# Patient Record
Sex: Female | Born: 1980 | Race: White | Hispanic: No | Marital: Married | Smoking: Never smoker
Health system: Southern US, Community
[De-identification: ages and names within clinical notes are randomized; demographics above are authoritative.]

## PROBLEM LIST (undated history)

## (undated) ENCOUNTER — Inpatient Hospital Stay (HOSPITAL_COMMUNITY): Payer: Self-pay

## (undated) DIAGNOSIS — R87629 Unspecified abnormal cytological findings in specimens from vagina: Secondary | ICD-10-CM

## (undated) DIAGNOSIS — IMO0002 Reserved for concepts with insufficient information to code with codable children: Secondary | ICD-10-CM

## (undated) DIAGNOSIS — F419 Anxiety disorder, unspecified: Secondary | ICD-10-CM

## (undated) DIAGNOSIS — K219 Gastro-esophageal reflux disease without esophagitis: Secondary | ICD-10-CM

## (undated) HISTORY — PX: COLPOSCOPY: SHX161

## (undated) HISTORY — DX: Unspecified abnormal cytological findings in specimens from vagina: R87.629

## (undated) HISTORY — DX: Anxiety disorder, unspecified: F41.9

## (undated) HISTORY — DX: Reserved for concepts with insufficient information to code with codable children: IMO0002

## (undated) HISTORY — PX: WISDOM TOOTH EXTRACTION: SHX21

## (undated) HISTORY — DX: Gastro-esophageal reflux disease without esophagitis: K21.9

---

## 2002-04-25 ENCOUNTER — Other Ambulatory Visit: Admission: RE | Admit: 2002-04-25 | Discharge: 2002-04-25 | Payer: Self-pay | Admitting: Family Medicine

## 2003-07-25 ENCOUNTER — Other Ambulatory Visit: Admission: RE | Admit: 2003-07-25 | Discharge: 2003-07-25 | Payer: Self-pay | Admitting: Family Medicine

## 2004-04-10 ENCOUNTER — Other Ambulatory Visit: Admission: RE | Admit: 2004-04-10 | Discharge: 2004-04-10 | Payer: Self-pay | Admitting: Family Medicine

## 2005-06-05 ENCOUNTER — Other Ambulatory Visit: Admission: RE | Admit: 2005-06-05 | Discharge: 2005-06-05 | Payer: Self-pay | Admitting: Family Medicine

## 2010-05-22 ENCOUNTER — Inpatient Hospital Stay (HOSPITAL_COMMUNITY)
Admission: AD | Admit: 2010-05-22 | Discharge: 2010-05-22 | Disposition: A | Discharge status: Home / Self Care | Payer: 59 | Source: Ambulatory Visit | Attending: Obstetrics and Gynecology | Admitting: Obstetrics and Gynecology

## 2010-05-22 ENCOUNTER — Inpatient Hospital Stay (HOSPITAL_COMMUNITY): Payer: 59

## 2010-05-22 DIAGNOSIS — O209 Hemorrhage in early pregnancy, unspecified: Secondary | ICD-10-CM | POA: Insufficient documentation

## 2010-05-22 LAB — CBC
HCT: 36.3 % (ref 36.0–46.0)
Hemoglobin: 13.2 g/dL (ref 12.0–15.0)
MCV: 80.3 fL (ref 78.0–100.0)
RBC: 4.52 MIL/uL (ref 3.87–5.11)
WBC: 6.3 10*3/uL (ref 4.0–10.5)

## 2010-05-22 LAB — WET PREP, GENITAL
Trich, Wet Prep: NONE SEEN
Yeast Wet Prep HPF POC: NONE SEEN

## 2010-05-22 LAB — HCG, QUANTITATIVE, PREGNANCY: hCG, Beta Chain, Quant, S: 9947 m[IU]/mL — ABNORMAL HIGH (ref <5)

## 2010-05-23 LAB — GC/CHLAMYDIA PROBE AMP, GENITAL: GC Probe Amp, Genital: NEGATIVE

## 2010-06-02 ENCOUNTER — Other Ambulatory Visit (HOSPITAL_COMMUNITY): Payer: Self-pay | Admitting: Obstetrics and Gynecology

## 2010-06-02 DIAGNOSIS — R58 Hemorrhage, not elsewhere classified: Secondary | ICD-10-CM

## 2010-10-16 ENCOUNTER — Other Ambulatory Visit (HOSPITAL_COMMUNITY): Payer: Self-pay | Admitting: Obstetrics and Gynecology

## 2010-10-16 DIAGNOSIS — Z3682 Encounter for antenatal screening for nuchal translucency: Secondary | ICD-10-CM

## 2010-10-28 LAB — HEPATITIS B SURFACE ANTIGEN: Hepatitis B Surface Ag: NEGATIVE

## 2010-10-28 LAB — ABO/RH: RH Type: POSITIVE

## 2010-10-28 LAB — RPR: RPR: NONREACTIVE

## 2010-11-04 ENCOUNTER — Ambulatory Visit (HOSPITAL_COMMUNITY)
Admission: RE | Admit: 2010-11-04 | Discharge: 2010-11-04 | Disposition: A | Discharge status: Home / Self Care | Payer: 59 | Source: Ambulatory Visit | Attending: Obstetrics and Gynecology | Admitting: Obstetrics and Gynecology

## 2010-11-04 ENCOUNTER — Encounter (HOSPITAL_COMMUNITY): Payer: Self-pay

## 2010-11-04 VITALS — BP 129/87 | HR 95 | Wt 185.0 lb

## 2010-11-04 DIAGNOSIS — O351XX0 Maternal care for (suspected) chromosomal abnormality in fetus, not applicable or unspecified: Secondary | ICD-10-CM | POA: Insufficient documentation

## 2010-11-04 DIAGNOSIS — Z3689 Encounter for other specified antenatal screening: Secondary | ICD-10-CM | POA: Insufficient documentation

## 2010-11-04 DIAGNOSIS — O3510X0 Maternal care for (suspected) chromosomal abnormality in fetus, unspecified, not applicable or unspecified: Secondary | ICD-10-CM | POA: Insufficient documentation

## 2010-11-04 DIAGNOSIS — Z3682 Encounter for antenatal screening for nuchal translucency: Secondary | ICD-10-CM

## 2010-11-04 NOTE — Progress Notes (Signed)
Patient seen for ultrasound only appointment today.  Please see [facility]-OBGYN report for details.  

## 2010-11-05 ENCOUNTER — Encounter: Payer: 59 | Admitting: Vascular Surgery

## 2010-11-06 ENCOUNTER — Other Ambulatory Visit: Payer: Self-pay | Admitting: Maternal and Fetal Medicine

## 2011-04-21 LAB — STREP B DNA PROBE: GBS: NEGATIVE

## 2011-04-22 NOTE — L&D Delivery Note (Signed)
Delivery Note Pt reached complete dilation and pushed 2 1/2 hours .  At 8:07 PM a healthy female was delivered via Vaginal, Spontaneous Delivery (Presentation: Right Occiput Anterior).  APGAR: 8, 9; weight 8#5oz. There was a mild shoulder dystocai that required Macroberts, posterior axillary lift, and suprapubic presasure to deliver.   Placenta status: Intact, Spontaneous.  Cord: 3 vessels with the following complications: None.   Anesthesia: Epidural  Episiotomy: n/a Lacerations:Partial 3rd degree Suture Repair: 0-vicryl and 2-0 vicryl to reinforce the rectal sphincter, 3-0 vicryl rapide on remainder of repair Est. Blood Loss (mL): 400cc  Mom to postpartum.  Baby to nursery-stable.  Terri Larsen 05/16/2011, 8:53 PM

## 2011-05-09 ENCOUNTER — Encounter (HOSPITAL_COMMUNITY): Payer: Self-pay | Admitting: *Deleted

## 2011-05-09 ENCOUNTER — Telehealth (HOSPITAL_COMMUNITY): Payer: Self-pay | Admitting: *Deleted

## 2011-05-09 NOTE — Telephone Encounter (Signed)
Preadmission screen  

## 2011-05-15 ENCOUNTER — Other Ambulatory Visit: Payer: Self-pay | Admitting: Obstetrics and Gynecology

## 2011-05-16 ENCOUNTER — Inpatient Hospital Stay (HOSPITAL_COMMUNITY): Payer: 59 | Admitting: Anesthesiology

## 2011-05-16 ENCOUNTER — Inpatient Hospital Stay (HOSPITAL_COMMUNITY)
Admission: RE | Admit: 2011-05-16 | Discharge: 2011-05-18 | DRG: 775 | Disposition: A | Discharge status: Home / Self Care | Payer: 59 | Source: Ambulatory Visit | Attending: Obstetrics and Gynecology | Admitting: Obstetrics and Gynecology

## 2011-05-16 ENCOUNTER — Encounter (HOSPITAL_COMMUNITY): Payer: Self-pay | Admitting: Anesthesiology

## 2011-05-16 ENCOUNTER — Encounter (HOSPITAL_COMMUNITY): Payer: Self-pay

## 2011-05-16 LAB — CBC
HCT: 30.7 % — ABNORMAL LOW (ref 36.0–46.0)
Hemoglobin: 9.3 g/dL — ABNORMAL LOW (ref 12.0–15.0)
MCHC: 33.2 g/dL (ref 30.0–36.0)
MCHC: 33.8 g/dL (ref 30.0–36.0)
Platelets: 140 10*3/uL — ABNORMAL LOW (ref 150–400)
Platelets: 121 10*3/uL — ABNORMAL LOW (ref 150–400)
RDW: 14.7 % (ref 11.5–15.5)
RDW: 14.7 % (ref 11.5–15.5)

## 2011-05-16 LAB — COMPREHENSIVE METABOLIC PANEL
ALT: 9 U/L (ref 0–35)
Albumin: 2.4 g/dL — ABNORMAL LOW (ref 3.5–5.2)
Calcium: 9 mg/dL (ref 8.4–10.5)
GFR calc Af Amer: >90 mL/min (ref >90)
Glucose, Bld: 128 mg/dL — ABNORMAL HIGH (ref 70–99)
Sodium: 132 mEq/L — ABNORMAL LOW (ref 135–145)
Total Protein: 5.7 g/dL — ABNORMAL LOW (ref 6.0–8.3)

## 2011-05-16 LAB — URINALYSIS, ROUTINE W REFLEX MICROSCOPIC
Bilirubin Urine: NEGATIVE
Leukocytes, UA: NEGATIVE
Nitrite: NEGATIVE
Specific Gravity, Urine: 1.01 (ref 1.005–1.030)
pH: 6 (ref 5.0–8.0)

## 2011-05-16 LAB — URINE MICROSCOPIC-ADD ON: WBC, UA: NONE SEEN WBC/hpf (ref <3)

## 2011-05-16 LAB — RPR: RPR Ser Ql: NONREACTIVE

## 2011-05-16 MED ORDER — BENZOCAINE-MENTHOL 20-0.5 % EX AERO
1.0000 "application " | INHALATION_SPRAY | CUTANEOUS | Status: DC | PRN
  Administered 2011-05-16: 1 via TOPICAL

## 2011-05-16 MED ORDER — DIPHENHYDRAMINE HCL 25 MG PO CAPS: 25.0000 mg | ORAL_CAPSULE | Freq: Four times a day (QID) | ORAL | Status: DC | PRN

## 2011-05-16 MED ORDER — OXYTOCIN BOLUS FROM INFUSION
500.0000 mL | Freq: Once | INTRAVENOUS | Status: DC
  Filled 2011-05-16: qty 500

## 2011-05-16 MED ORDER — OXYCODONE-ACETAMINOPHEN 5-325 MG PO TABS
1.0000 | ORAL_TABLET | ORAL | Status: DC | PRN
  Administered 2011-05-17 – 2011-05-18 (×5): 1 via ORAL
  Filled 2011-05-16 (×5): qty 1

## 2011-05-16 MED ORDER — LANOLIN HYDROUS EX OINT: TOPICAL_OINTMENT | CUTANEOUS | Status: DC | PRN

## 2011-05-16 MED ORDER — EPHEDRINE 5 MG/ML INJ: 10.0000 mg | INTRAVENOUS | Status: DC | PRN

## 2011-05-16 MED ORDER — DIPHENHYDRAMINE HCL 50 MG/ML IJ SOLN: 12.5000 mg | INTRAMUSCULAR | Status: DC | PRN

## 2011-05-16 MED ORDER — PHENYLEPHRINE 40 MCG/ML (10ML) SYRINGE FOR IV PUSH (FOR BLOOD PRESSURE SUPPORT): 80.0000 ug | PREFILLED_SYRINGE | INTRAVENOUS | Status: DC | PRN

## 2011-05-16 MED ORDER — OXYCODONE-ACETAMINOPHEN 5-325 MG PO TABS: 2.0000 | ORAL_TABLET | ORAL | Status: DC | PRN

## 2011-05-16 MED ORDER — PHENYLEPHRINE 40 MCG/ML (10ML) SYRINGE FOR IV PUSH (FOR BLOOD PRESSURE SUPPORT)
80.0000 ug | PREFILLED_SYRINGE | INTRAVENOUS | Status: DC | PRN
  Filled 2011-05-16: qty 5

## 2011-05-16 MED ORDER — ONDANSETRON HCL 4 MG/2ML IJ SOLN: 4.0000 mg | INTRAMUSCULAR | Status: DC | PRN

## 2011-05-16 MED ORDER — FLEET ENEMA 7-19 GM/118ML RE ENEM: 1.0000 | ENEMA | RECTAL | Status: DC | PRN

## 2011-05-16 MED ORDER — LACTATED RINGERS IV SOLN
500.0000 mL | Freq: Once | INTRAVENOUS | Status: AC
  Administered 2011-05-16: 500 mL via INTRAVENOUS

## 2011-05-16 MED ORDER — WITCH HAZEL-GLYCERIN EX PADS
1.0000 "application " | MEDICATED_PAD | CUTANEOUS | Status: DC | PRN
  Administered 2011-05-16: 1 via TOPICAL

## 2011-05-16 MED ORDER — OXYTOCIN 20 UNITS IN LACTATED RINGERS INFUSION - SIMPLE
125.0000 mL/h | Freq: Once | INTRAVENOUS | Status: AC
  Administered 2011-05-16: 125 mL/h via INTRAVENOUS

## 2011-05-16 MED ORDER — IBUPROFEN 600 MG PO TABS
600.0000 mg | ORAL_TABLET | Freq: Four times a day (QID) | ORAL | Status: DC
  Administered 2011-05-16 – 2011-05-18 (×7): 600 mg via ORAL
  Filled 2011-05-16 (×7): qty 1

## 2011-05-16 MED ORDER — TETANUS-DIPHTH-ACELL PERTUSSIS 5-2.5-18.5 LF-MCG/0.5 IM SUSP: 0.5000 mL | Freq: Once | INTRAMUSCULAR | Status: DC

## 2011-05-16 MED ORDER — ONDANSETRON HCL 4 MG PO TABS: 4.0000 mg | ORAL_TABLET | ORAL | Status: DC | PRN

## 2011-05-16 MED ORDER — DIBUCAINE 1 % RE OINT
1.0000 "application " | TOPICAL_OINTMENT | RECTAL | Status: DC | PRN
  Administered 2011-05-16: 1 via RECTAL
  Filled 2011-05-16: qty 28

## 2011-05-16 MED ORDER — LIDOCAINE HCL 1.5 % IJ SOLN
INTRAMUSCULAR | Status: DC | PRN
  Administered 2011-05-16 (×2): 4 mL via INTRADERMAL

## 2011-05-16 MED ORDER — CITRIC ACID-SODIUM CITRATE 334-500 MG/5ML PO SOLN: 30.0000 mL | ORAL | Status: DC | PRN

## 2011-05-16 MED ORDER — FENTANYL 2.5 MCG/ML BUPIVACAINE 1/10 % EPIDURAL INFUSION (WH - ANES)
14.0000 mL/h | INTRAMUSCULAR | Status: DC
  Administered 2011-05-16 (×3): 14 mL/h via EPIDURAL
  Filled 2011-05-16 (×3): qty 60

## 2011-05-16 MED ORDER — ONDANSETRON HCL 4 MG/2ML IJ SOLN: 4.0000 mg | Freq: Four times a day (QID) | INTRAMUSCULAR | Status: DC | PRN

## 2011-05-16 MED ORDER — PANTOPRAZOLE SODIUM 40 MG PO TBEC
40.0000 mg | DELAYED_RELEASE_TABLET | Freq: Every day | ORAL | Status: DC
  Filled 2011-05-16 (×3): qty 1

## 2011-05-16 MED ORDER — TERBUTALINE SULFATE 1 MG/ML IJ SOLN: 0.2500 mg | Freq: Once | INTRAMUSCULAR | Status: DC | PRN

## 2011-05-16 MED ORDER — ZOLPIDEM TARTRATE 5 MG PO TABS: 5.0000 mg | ORAL_TABLET | Freq: Every evening | ORAL | Status: DC | PRN

## 2011-05-16 MED ORDER — LACTATED RINGERS IV SOLN: 500.0000 mL | INTRAVENOUS | Status: DC | PRN

## 2011-05-16 MED ORDER — SENNOSIDES-DOCUSATE SODIUM 8.6-50 MG PO TABS
2.0000 | ORAL_TABLET | Freq: Every day | ORAL | Status: DC
  Administered 2011-05-17: 2 via ORAL

## 2011-05-16 MED ORDER — OXYTOCIN 20 UNITS IN LACTATED RINGERS INFUSION - SIMPLE
1.0000 m[IU]/min | INTRAVENOUS | Status: DC
Start: 2011-05-16 — End: 2011-05-16
  Administered 2011-05-16: 2 m[IU]/min via INTRAVENOUS
  Filled 2011-05-16: qty 1000

## 2011-05-16 MED ORDER — PRENATAL MULTIVITAMIN CH
1.0000 | ORAL_TABLET | Freq: Every day | ORAL | Status: DC
  Administered 2011-05-17 – 2011-05-18 (×2): 1 via ORAL
  Filled 2011-05-16 (×2): qty 1

## 2011-05-16 MED ORDER — ACETAMINOPHEN 325 MG PO TABS: 650.0000 mg | ORAL_TABLET | ORAL | Status: DC | PRN

## 2011-05-16 MED ORDER — IBUPROFEN 600 MG PO TABS: 600.0000 mg | ORAL_TABLET | Freq: Four times a day (QID) | ORAL | Status: DC | PRN

## 2011-05-16 MED ORDER — LIDOCAINE HCL (PF) 1 % IJ SOLN
30.0000 mL | INTRAMUSCULAR | Status: DC | PRN
  Filled 2011-05-16: qty 30

## 2011-05-16 MED ORDER — BENZOCAINE-MENTHOL 20-0.5 % EX AERO
INHALATION_SPRAY | CUTANEOUS | Status: AC
  Filled 2011-05-16: qty 56

## 2011-05-16 MED ORDER — LACTATED RINGERS IV SOLN
INTRAVENOUS | Status: DC
  Administered 2011-05-16 (×2): 125 mL/h via INTRAVENOUS

## 2011-05-16 MED ORDER — SIMETHICONE 80 MG PO CHEW: 80.0000 mg | CHEWABLE_TABLET | ORAL | Status: DC | PRN

## 2011-05-16 MED ORDER — EPHEDRINE 5 MG/ML INJ
10.0000 mg | INTRAVENOUS | Status: DC | PRN
  Filled 2011-05-16: qty 4

## 2011-05-16 NOTE — H&P (Signed)
Terri Larsen is a 31 y.o. female presenting at 40 weeks for induction of labor given term status and favorable cervix.EDD 05/16/11 by LMP c/w 9 week [place]) Prenatal care has been significant only for some elevated diastolic BP to 90's but no sign of preeclampsia.  Labs normal and no proteinuria.  Pt was a possible thickened NT on [place] at 9 weeks, but normal first trimester screen.  History OB History    Grav Para Term Preterm Abortions TAB SAB Ect Mult Living   2 0 0 0 1 0 1 0 0 0     SAB x 1   Past Medical History  Diagnosis Date  . Anxiety     no meds  . GERD (gastroesophageal reflux disease)     middle school  . Abnormal Pap smear    Past Surgical History  Procedure Date  . Colposcopy   . Wisdom tooth extraction    Family History: family history includes Alcohol abuse in her maternal aunt, maternal grandfather, and maternal uncle; Cancer in her maternal grandmother, paternal aunt, and paternal grandmother; Diabetes in her father and paternal grandfather; GER disease in her mother; Heart attack in her paternal grandfather; Heart disease in her paternal aunt, paternal grandfather, and paternal uncle; Hypertension in her father, paternal aunt, paternal grandfather, and paternal uncle; and Seizures in her mother. Social History:  reports that she has never smoked. She has never used smokeless tobacco. She reports that she does not drink alcohol or use illicit drugs.  Review of Systems  Neurological: Negative for headaches.      Blood pressure 133/95, pulse 109, temperature 98.3 F (36.8 C), temperature source Oral, resp. rate 18, height 5\' 5"  (1.651 m), weight 95.255 kg (210 lb), last menstrual period 08/09/2010. Maternal Exam:  Uterine Assessment: Contraction strength is mild.  Contraction frequency is irregular.   Abdomen: Patient reports no abdominal tenderness. Fetal presentation: vertex  Introitus: Normal vulva. Normal vagina.    Fetal Exam Fetal State Assessment: Category I -  tracings are normal.     Physical Exam  Constitutional: She is oriented to person, place, and time. She appears well-developed and well-nourished.  Cardiovascular: Normal rate and regular rhythm.   Respiratory: Effort normal and breath sounds normal.  GI: Soft. Bowel sounds are normal.  Genitourinary: Vagina normal and uterus normal.  Neurological: She is alert and oriented to person, place, and time.  Psychiatric: She has a normal mood and affect.    Prenatal labs: ABO, Rh: O/Positive/-- (07/09 0000) Antibody: Negative (07/09 0000) Rubella: Immune (07/09 0000) RPR: Nonreactive (07/09 0000)  HBsAg: Negative (07/09 0000)  HIV: Non-reactive (07/09 0000)  GBS: Negative (12/31 0000)  One hour GTT 117 First trimester scree and AFP negative  Assessment/Plan: Pt for induction of labor with favorable cervix at 40 weeks, has had some borderline BP withdiastolics to 90, but normal labs and no proteinuria to suggest preeclampsia.  Pitocin and AROM. Oliver Pila 05/16/2011, 8:07 AM

## 2011-05-16 NOTE — Anesthesia Procedure Notes (Signed)
Epidural Patient location during procedure: OB Start time: 05/16/2011 11:46 AM Reason for block: procedure for pain  Staffing Performed by: anesthesiologist   Preanesthetic Checklist Completed: patient identified, site marked, surgical consent, pre-op evaluation, timeout performed, IV checked, risks and benefits discussed and monitors and equipment checked  Epidural Patient position: sitting Prep: site prepped and draped and DuraPrep Patient monitoring: continuous pulse ox and blood pressure Approach: midline Injection technique: LOR air  Needle:  Needle type: Tuohy  Needle gauge: 17 G Needle length: 9 cm Needle insertion depth: 7 cm Catheter type: closed end flexible Catheter size: 19 Gauge Catheter at skin depth: 12 cm Test dose: negative  Assessment Events: blood not aspirated, injection not painful, no injection resistance, negative IV test and no paresthesia  Additional Notes Discussed risk of headache, infection, bleeding, nerve injury and failed or incomplete block.  Patient voices understanding and wishes to proceed.

## 2011-05-16 NOTE — Progress Notes (Signed)
Called Anesthesia with repeat Platelet count. MD orders ok to pull epidural cath at this time.

## 2011-05-16 NOTE — Progress Notes (Signed)
Dr. Senaida Ores at bedside and notified of pt status, FHR, UC pattern, pitocin amount, and epidural placement.  Will continue to monitor.

## 2011-05-16 NOTE — Anesthesia Preprocedure Evaluation (Signed)
Anesthesia Evaluation  Patient identified by MRN, date of birth, ID band Patient awake    Reviewed: Allergy & Precautions, H&P , NPO status , Patient's Chart, lab work & pertinent test results, reviewed documented beta blocker date and time   History of Anesthesia Complications Negative for: history of anesthetic complications  Airway Mallampati: I TM Distance: >3 FB Neck ROM: full    Dental  (+) Teeth Intact   Pulmonary neg pulmonary ROS,  clear to auscultation        Cardiovascular hypertension (GHTN past 3-4 weeks), regular Normal    Neuro/Psych Negative Neurological ROS  Negative Psych ROS   GI/Hepatic Neg liver ROS, GERD-  ,  Endo/Other  Negative Endocrine ROS  Renal/GU negative Renal ROS  Genitourinary negative   Musculoskeletal   Abdominal   Peds  Hematology negative hematology ROS (+)   Anesthesia Other Findings   Reproductive/Obstetrics (+) Pregnancy                           Anesthesia Physical Anesthesia Plan  ASA: II  Anesthesia Plan: Epidural   Post-op Pain Management:    Induction:   Airway Management Planned:   Additional Equipment:   Intra-op Plan:   Post-operative Plan:   Informed Consent: I have reviewed the patients History and Physical, chart, labs and discussed the procedure including the risks, benefits and alternatives for the proposed anesthesia with the patient or authorized representative who has indicated his/her understanding and acceptance.     Plan Discussed with:   Anesthesia Plan Comments:         Anesthesia Quick Evaluation

## 2011-05-16 NOTE — Progress Notes (Signed)
   Subjective: Some occasional pressure  Objective: BP 139/89  Pulse 90  Temp(Src) 97.8 F (36.6 C) (Axillary)  Resp 20  Ht 5\' 5"  (1.651 m)  Wt 95.255 kg (210 lb)  BMI 34.95 kg/m2  SpO2 99%  LMP 08/09/2010   Total I/O In: -  Out: 50 [Urine:50]  FHT:  FHR: 135 bpm, variability: moderate,  accelerations:  Present,  decelerations:  Present mild early decels UC:   regular, every 2-3 minutes SVE:  C/9/0  Labs: Lab Results  Component Value Date   WBC 6.7 05/16/2011   HGB 10.2* 05/16/2011   HCT 30.7* 05/16/2011   MCV 79.5 05/16/2011   PLT 140* 05/16/2011    Assessment / Plan: Good progress.     Oliver Pila 05/16/2011, 4:12 PM

## 2011-05-16 NOTE — Progress Notes (Signed)
   Subjective: Pt feels mild ctx only Objective: BP 130/88  Pulse 90  Temp(Src) 98.3 F (36.8 C) (Oral)  Resp 20  Ht 5\' 5"  (1.651 m)  Wt 95.255 kg (210 lb)  BMI 34.95 kg/m2  LMP 08/09/2010      FHT:  FHR: 145 bpm, variability: moderate,  accelerations:  Present,  decelerations:  Absent UC:   irregular, every 6-8 minutes SVE:   Dilation: 3.5 Effacement (%): 60 Station: -2 Exam by:: Dr. Senaida Ores AROM clear  Labs: Lab Results  Component Value Date   WBC 6.7 05/16/2011   HGB 10.2* 05/16/2011   HCT 30.7* 05/16/2011   MCV 79.5 05/16/2011   PLT 140* 05/16/2011    Assessment / Plan: On pitocin, plans epidural. Will get PIH labs and check urine for protein.  Terri Larsen 05/16/2011, 9:23 AM

## 2011-05-16 NOTE — Progress Notes (Signed)
   Subjective: Comfortable with epidural  Objective: BP 148/103  Pulse 83  Temp(Src) 97.3 F (36.3 C) (Axillary)  Resp 20  Ht 5\' 5"  (1.651 m)  Wt 95.255 kg (210 lb)  BMI 34.95 kg/m2  SpO2 99%  LMP 08/09/2010   Total I/O In: -  Out: 50 [Urine:50]  FHT:  FHR: 130 bpm, variability: moderate,  accelerations:  Present,  decelerations:  Absent UC:   regular, every 3-5 minutes SVE:   Dilation: 4.5 Effacement (%): 70 Station: -1 Exam by:: Dr. Senaida Ores IUPC placed Labs: Lab Results  Component Value Date   WBC 6.7 05/16/2011   HGB 10.2* 05/16/2011   HCT 30.7* 05/16/2011   MCV 79.5 05/16/2011   PLT 140* 05/16/2011    Assessment / Plan: Follow progress Terri Larsen W 05/16/2011, 1:02 PM

## 2011-05-16 NOTE — Progress Notes (Signed)
pt does not desire anything at this point for pain

## 2011-05-17 LAB — CBC
HCT: 26.7 % — ABNORMAL LOW (ref 36.0–46.0)
Hemoglobin: 8.8 g/dL — ABNORMAL LOW (ref 12.0–15.0)
MCH: 26.6 pg (ref 26.0–34.0)
MCHC: 33 g/dL (ref 30.0–36.0)
MCV: 80.7 fL (ref 78.0–100.0)
Platelets: 125 K/uL — ABNORMAL LOW (ref 150–400)
RBC: 3.31 MIL/uL — ABNORMAL LOW (ref 3.87–5.11)
RDW: 14.6 % (ref 11.5–15.5)
WBC: 10.3 K/uL (ref 4.0–10.5)

## 2011-05-17 NOTE — Progress Notes (Signed)
Post Partum Day 1 Subjective: up ad lib, voiding and tolerating PO  Objective: Blood pressure 120/78, pulse 78, temperature 97.6 F (36.4 C), temperature source Axillary, resp. rate 20, height 5\' 5"  (1.651 m), weight 95.255 kg (210 lb), last menstrual period 08/09/2010, SpO2 98.00%, unknown if currently breastfeeding.  Physical Exam:  General: alert Lochia: appropriate Uterine Fundus: firm    Basename 05/17/11 0500 05/16/11 2124  HGB 8.8* 9.3*  HCT 26.7* 27.5*    Assessment/Plan: Plan for discharge tomorrow   LOS: 1 day   Terri Larsen W 05/17/2011, 10:38 AM

## 2011-05-18 MED ORDER — OXYCODONE-ACETAMINOPHEN 5-325 MG PO TABS: 1.0000 | ORAL_TABLET | ORAL | Status: AC | PRN

## 2011-05-18 MED ORDER — IBUPROFEN 600 MG PO TABS: 600.0000 mg | ORAL_TABLET | Freq: Four times a day (QID) | ORAL | Status: AC

## 2011-05-18 NOTE — Discharge Summary (Signed)
Obstetric Discharge Summary Reason for Admission: induction of labor Prenatal Procedures: none Intrapartum Procedures: spontaneous vaginal delivery Postpartum Procedures: none Complications-Operative and Postpartum: partial third degree degree perineal laceration Hemoglobin  Date Value Range Status  05/17/2011 8.8* 12.0-15.0 (g/dL) Final     HCT  Date Value Range Status  05/17/2011 26.7* 36.0-46.0 (%) Final    Discharge Diagnoses: Term Pregnancy-delivered  Discharge Information: Date: 05/18/2011 Activity: pelvic rest Diet: routine Medications: Ibuprofen and Percocet Condition: improved Instructions: refer to practice specific booklet Discharge to: home   Newborn Data: Live born female  Birth Weight: 8 lb 5.3 oz (3780 g) APGAR: 8, 9  Home with mother.  Terri Larsen 05/18/2011, 8:04 AM

## 2011-05-18 NOTE — Progress Notes (Signed)
Post Partum Day 2 Subjective: up ad lib, voiding and tolerating PO  Objective: Blood pressure 113/76, pulse 87, temperature 97.6 F (36.4 C), temperature source Oral, resp. rate 18, height 5\' 5"  (1.651 m), weight 95.255 kg (210 lb), last menstrual period 08/09/2010, SpO2 98.00%, unknown if currently breastfeeding.  Physical Exam:  General: alert Lochia: appropriate Uterine Fundus: firm    Basename 05/17/11 0500 05/16/11 2124  HGB 8.8* 9.3*  HCT 26.7* 27.5*    Assessment/Plan: Discharge home Motrin and percocet F/u 6 weeks   LOS: 2 days   Furqan Gosselin W 05/18/2011, 8:07 AM

## 2011-05-19 NOTE — Anesthesia Postprocedure Evaluation (Signed)
Patient stable following vaginal delivery.  

## 2014-02-20 ENCOUNTER — Encounter (HOSPITAL_COMMUNITY): Payer: Self-pay

## 2014-04-21 NOTE — L&D Delivery Note (Signed)
Delivery Note At 5:46 PM a viable and healthy female was delivered via Vaginal, Spontaneous Delivery (Presentation: OA, ROT ).  APGAR: 9, 9; weight P .   Placenta status: Intact, Spontaneous.  Cord: 3 vessels with the following complications: None.    Anesthesia: Epidural  Episiotomy:  none Lacerations:  2nd degree lac Suture Repair: 3.0 vicryl rapide Est. Blood Loss (mL):  90cc  Mom to postpartum.  Baby to Couplet care / Skin to Skin.  Bovard-Stuckert, Hannan Tetzlaff 12/21/2014, 6:15 PM  Br/Contra?/RI/Tdap in [facility]  D/w pt and FOB circumcision for female infant - including bleeding, infection and removing too much or too little skin, wishes to proceed.

## 2014-05-25 LAB — OB RESULTS CONSOLE HEPATITIS B SURFACE ANTIGEN: Hepatitis B Surface Ag: NEGATIVE

## 2014-05-25 LAB — OB RESULTS CONSOLE ABO/RH: RH Type: POSITIVE

## 2014-05-25 LAB — OB RESULTS CONSOLE GC/CHLAMYDIA
Chlamydia: NEGATIVE
Gonorrhea: NEGATIVE

## 2014-05-25 LAB — OB RESULTS CONSOLE HIV ANTIBODY (ROUTINE TESTING): HIV: NONREACTIVE

## 2014-05-25 LAB — OB RESULTS CONSOLE ANTIBODY SCREEN: ANTIBODY SCREEN: NEGATIVE

## 2014-05-25 LAB — OB RESULTS CONSOLE RUBELLA ANTIBODY, IGM: Rubella: IMMUNE

## 2014-06-24 ENCOUNTER — Encounter (HOSPITAL_COMMUNITY): Payer: Self-pay

## 2014-06-24 ENCOUNTER — Inpatient Hospital Stay (HOSPITAL_COMMUNITY)
Admission: AD | Admit: 2014-06-24 | Discharge: 2014-06-24 | Disposition: A | Discharge status: Home / Self Care | Payer: 59 | Source: Ambulatory Visit | Attending: Obstetrics and Gynecology | Admitting: Obstetrics and Gynecology

## 2014-06-24 DIAGNOSIS — A059 Bacterial foodborne intoxication, unspecified: Secondary | ICD-10-CM

## 2014-06-24 DIAGNOSIS — O9989 Other specified diseases and conditions complicating pregnancy, childbirth and the puerperium: Secondary | ICD-10-CM | POA: Insufficient documentation

## 2014-06-24 DIAGNOSIS — K529 Noninfective gastroenteritis and colitis, unspecified: Secondary | ICD-10-CM | POA: Diagnosis not present

## 2014-06-24 DIAGNOSIS — Z3A14 14 weeks gestation of pregnancy: Secondary | ICD-10-CM

## 2014-06-24 DIAGNOSIS — O99891 Other specified diseases and conditions complicating pregnancy: Secondary | ICD-10-CM

## 2014-06-24 DIAGNOSIS — Z3A13 13 weeks gestation of pregnancy: Secondary | ICD-10-CM | POA: Insufficient documentation

## 2014-06-24 DIAGNOSIS — O210 Mild hyperemesis gravidarum: Secondary | ICD-10-CM | POA: Diagnosis present

## 2014-06-24 LAB — URINALYSIS, ROUTINE W REFLEX MICROSCOPIC
Bilirubin Urine: NEGATIVE
GLUCOSE, UA: NEGATIVE mg/dL
Hgb urine dipstick: NEGATIVE
KETONES UR: 15 mg/dL — AB
LEUKOCYTES UA: NEGATIVE
NITRITE: NEGATIVE
PH: 8 (ref 5.0–8.0)
Protein, ur: NEGATIVE mg/dL
Specific Gravity, Urine: 1.015 (ref 1.005–1.030)
UROBILINOGEN UA: 0.2 mg/dL (ref 0.0–1.0)

## 2014-06-24 MED ORDER — PROMETHAZINE HCL 25 MG RE SUPP: 25.0000 mg | Freq: Four times a day (QID) | RECTAL | Status: DC | PRN

## 2014-06-24 MED ORDER — DEXTROSE 5 % IN LACTATED RINGERS IV BOLUS
1000.0000 mL | Freq: Once | INTRAVENOUS | Status: AC
  Administered 2014-06-24: 1000 mL via INTRAVENOUS

## 2014-06-24 MED ORDER — PROMETHAZINE HCL 25 MG/ML IJ SOLN
25.0000 mg | Freq: Once | INTRAVENOUS | Status: AC
  Administered 2014-06-24: 25 mg via INTRAVENOUS
  Filled 2014-06-24: qty 1

## 2014-06-24 MED ORDER — PROMETHAZINE HCL 25 MG PO TABS: 25.0000 mg | ORAL_TABLET | Freq: Four times a day (QID) | ORAL | Status: DC | PRN

## 2014-06-24 NOTE — MAU Note (Signed)
Pt states she and her sister ate crab dip. Pt began vomiting around 0200 this am, then hourly until 0800. No bleeding or abnormal vaginal discharge.

## 2014-06-24 NOTE — Discharge Instructions (Signed)
Food Poisoning °Food poisoning is an illness caused by something you ate or drank. There are over 250 known causes of food poisoning. However, many other causes are unknown. You can be treated even if the exact cause of your food poisoning is not known. In most cases, food poisoning is mild and lasts 1 to 2 days. However, some cases can be serious, especially for people with low immune systems, the elderly, children and infants, and pregnant women. °CAUSES  °Poor personal hygiene, improper cleaning of storage and preparation areas, and unclean utensils can cause infection or tainting (contamination) of foods. The causes of food poisoning are numerous. Infectious agents, such as viruses, bacteria, or parasites, can cause harm by infecting the intestine and disrupting the absorption of nutrients and water. This can cause diarrhea and lead to dehydration. Viruses are responsible for most of the food poisonings in which an agent is found. Parasites are less likely to cause food poisoning. Toxic agents, such as poisonous mushrooms, marine algae, and pesticides can also cause food poisoning. °· Viral causes of food poisoning include: °¨ Norovirus. °¨ Rotavirus. °¨ Hepatitis A. °· Bacterial causes of food poisoning include: °¨ Salmonellae. °¨ Campylobacter. °¨ Bacillus cereus. °¨ Escherichia coli (E. coli). °¨ Shigella. °¨ Listeria monocytogenes. °¨ Clostridium botulinum (botulism). °¨ Vibrio cholerae. °· Parasites that can cause food poisoning include: °¨ Giardia. °¨ Cryptosporidium. °¨ Toxoplasma. °SYMPTOMS °Symptoms may appear several hours or longer after consuming the contaminated food or drink. Symptoms may include: °· Nausea. °· Vomiting. °· Cramping. °· Diarrhea. °· Fever and chills. °· Muscle aches. °DIAGNOSIS °Your health care provider may be able to diagnose food poisoning from a list of what you have recently eaten and results from lab tests. Diagnostic tests may include an exam of the feces. °TREATMENT °In  most cases, treatment focuses on helping to relieve your symptoms and staying well hydrated. Antibiotic medicines are rarely needed. In severe cases, hospitalization may be required. °HOME CARE INSTRUCTIONS  °· Drink enough water and fluids to keep your urine clear or pale yellow. Drink small amounts of fluids frequently and increase as tolerated. °· Ask your health care provider for specific rehydration instructions. °· Avoid: °¨ Foods high in sugar. °¨ Alcohol. °¨ Carbonated drinks. °¨ Tobacco. °¨ Juice. °¨ Caffeine drinks. °¨ Extremely hot or cold fluids. °¨ Fatty, greasy foods. °¨ Too much intake of anything at one time. °¨ Dairy products until 24 to 48 hours after diarrhea stops. °· You may consume probiotics. Probiotics are active cultures of beneficial bacteria. They may lessen the amount and number of diarrheal stools in adults. Probiotics can be found in yogurt with active cultures and in supplements. °· Wash your hands well to avoid spreading the bacteria. °· Take medicines only as directed by your health care provider. Do not give your child aspirin because of the association with Reye's syndrome. °· Ask your health care provider if you should continue to take your regular prescribed and over-the-counter medicines. °PREVENTION  °· Wash your hands, food preparation surfaces, and utensils thoroughly before and after handling raw foods. °· Keep refrigerated foods below 40°F (5°C). °· Serve hot foods immediately or keep them heated above 140°F (60°C). °· Divide large volumes of food into small portions for rapid cooling in the refrigerator. Hot, bulky foods in the refrigerator can raise the temperature of other foods that have already cooled. °· Follow approved canning procedures. °· Heat canned foods thoroughly before tasting. °· When in doubt, throw it out. °· Infants, the elderly, women   who are pregnant, and people with compromised immune systems are especially susceptible to food poisoning. These people  should never consume unpasteurized cheese, unpasteurized cider, raw fish, raw seafood, or raw meat-type products. °SEEK IMMEDIATE MEDICAL CARE IF:  °· You have difficulty breathing, swallowing, talking, or moving. °· You develop blurred vision. °· You are unable to keep fluids down. °· You faint or nearly faint. °· Your eyes turn yellow. °· Vomiting or diarrhea develops or becomes persistent. °· Abdominal pain develops, increases, or localizes in one small area. °· You have a fever. °· The diarrhea becomes excessive or contains blood or mucus. °· You develop excessive weakness, dizziness, or extreme thirst. °· You have no urine for 8 hours. °MAKE SURE YOU:  °· Understand these instructions. °· Will watch your condition. °· Will get help right away if you are not doing well or get worse. °Document Released: 01/04/2004 Document Revised: 08/22/2013 Document Reviewed: 08/22/2010 °ExitCare® Patient Information ©2015 [employer]. This information is not intended to replace advice given to you by your health care provider. Make sure you discuss any questions you have with your health care provider. ° °

## 2014-06-24 NOTE — MAU Provider Note (Signed)
Chief Complaint: Emesis   First Provider Initiated Contact with Patient 06/24/14 [phone]     SUBJECTIVE HPI: Terri Larsen is a 34 y.o. G3P1011 at [redacted]w[redacted]d by LMP who presents with nausea and vomiting since 2 AM. Vomited at least 6 times. Unable to keep anything down. Denies fever, chills, diarrhea. Sister has same symptoms. Thinks it may be from crab dip that she and her sister ate at 7 PM last night.   Past Medical History  Diagnosis Date  . Anxiety     no meds  . GERD (gastroesophageal reflux disease)     middle school  . Abnormal Pap smear    OB History  Gravida Para Term Preterm AB SAB TAB Ectopic Multiple Living  0 1 1 0 0 0 1    # Outcome Date GA Lbr Len/2nd Weight Sex Delivery Anes PTL Lv  3 Current           2 Term 05/16/11 [redacted]w[redacted]d 05:04 / 02:33 3.78 kg (8 lb 5.3 oz) F Vag-Spont EPI  Y  1 SAB  [redacted]w[redacted]d            Past Surgical History  Procedure Laterality Date  . Colposcopy    . Wisdom tooth extraction     History   Social History  . Marital Status: Married    Spouse Name: N/A  . Number of Children: N/A  . Years of Education: N/A   Occupational History  . Not on file.   Social History Main Topics  . Smoking status: Never Smoker   . Smokeless tobacco: Never Used  . Alcohol Use: No  . Drug Use: No  . Sexual Activity: Yes   Other Topics Concern  . Not on file   Social History Narrative   No current facility-administered medications on file prior to encounter.   Current Outpatient Prescriptions on File Prior to Encounter  Medication Sig Dispense Refill  . pantoprazole (PROTONIX) 40 MG tablet Take 40 mg by mouth daily.      . Prenatal Vit-Fe Fumarate-FA (PRENATAL MULTIVITAMIN) TABS Take 1 tablet by mouth daily.     No Known Allergies  Review of Systems  Constitutional: Negative for fever and chills.  HENT: Negative for congestion and sore throat.   Respiratory: Negative for cough.   Gastrointestinal: Positive for nausea and vomiting. Negative for  abdominal pain and diarrhea.  Genitourinary:       Negative for vaginal bleeding.  Musculoskeletal: Negative for myalgias.   OBJECTIVE Blood pressure 151/90, pulse 104, temperature 97.9 F (36.6 C), temperature source Oral, resp. rate 16, height 5' 5.5" (1.664 m), weight 92.761 kg (204 lb 8 oz), not currently breastfeeding.  Patient Vitals for the past 24 hrs:  BP Temp Temp src Pulse Resp Height Weight  06/24/14 0920 151/90 mmHg 97.9 F (36.6 C) Oral 104 16 - -  06/24/14 0917 - - - - - 5' 5.5" (1.664 m) 92.761 kg (204 lb 8 oz)   GENERAL: Well-developed, well-nourished female in no acute distress.  HEENT: Normocephalic. Mucous membranes moist. HEART: normal rate RESP: normal effort ABDOMEN: Soft, non-tender. Gravid, size equals dates. EXTREMITIES: Nontender, no edema NEURO: Alert and oriented SPECULUM EXAM: Deferred Fetal heart rate 140 by Doppler.  LAB RESULTS Results for orders placed or performed during the hospital encounter of 06/24/14 (from the past 24 hour(s))  Urinalysis, Routine w reflex microscopic     Status: Abnormal   Collection Time: 06/24/14  9:08 AM  Result Value Ref Range  Color, Urine YELLOW YELLOW   APPearance CLEAR CLEAR   Specific Gravity, Urine 1.015 1.005 - 1.030   pH 8.0 5.0 - 8.0   Glucose, UA NEGATIVE NEGATIVE mg/dL   Hgb urine dipstick NEGATIVE NEGATIVE   Bilirubin Urine NEGATIVE NEGATIVE   Ketones, ur 15 (A) NEGATIVE mg/dL   Protein, ur NEGATIVE NEGATIVE mg/dL   Urobilinogen, UA 0.2 0.0 - 1.0 mg/dL   Nitrite NEGATIVE NEGATIVE   Leukocytes, UA NEGATIVE NEGATIVE    IMAGING No results found.  MAU COURSE Patient doesn't think she can keep down oral medication. Phenergan and IV fluid bolus.   No further vomiting in maternity admissions. Feeling better. Tolerating PO's.   ASSESSMENT 1. Foodborne gastroenteritis   2. Current maternal conditions classifiable elsewhere, antepartum     PLAN Discharge home in stable condition per consult with  Dr. Ellyn HackBovard. Clear liquid diet 24 hours, then advance slowly as tolerated.     Follow-up Information    Follow up with Sherian ReinBovard-Stuckert, Jody, MD.   Specialty:  Obstetrics and Gynecology   Why:  As scheduled   Contact information:   [address] [place] [phone]       [facility].   Why:  As needed in emergencies   Contact information:   [address] [id number] [place] [id number] [phone]       Medication List    TAKE these medications        cetirizine 10 MG tablet  Commonly known as:  ZYRTEC  Take 10 mg by mouth daily.     pantoprazole 40 MG tablet  Commonly known as:  PROTONIX  Take 40 mg by mouth daily.     prenatal multivitamin Tabs tablet  Take 1 tablet by mouth daily.     promethazine 25 MG suppository  Commonly known as:  PHENERGAN  Place 1 suppository (25 mg total) rectally every 6 (six) hours as needed for nausea or vomiting.     promethazine 25 MG tablet  Commonly known as:  PHENERGAN  Take 1 tablet (25 mg total) by mouth every 6 (six) hours as needed.       [place] Dam[place]ri Oz[place], CNM 06/24/2014  11:32 AM

## 2014-12-13 ENCOUNTER — Telehealth (HOSPITAL_COMMUNITY): Payer: Self-pay | Admitting: *Deleted

## 2014-12-13 ENCOUNTER — Encounter (HOSPITAL_COMMUNITY): Payer: Self-pay | Admitting: *Deleted

## 2014-12-13 NOTE — Telephone Encounter (Signed)
Preadmission screen  

## 2014-12-21 ENCOUNTER — Inpatient Hospital Stay (HOSPITAL_COMMUNITY): Payer: 59 | Admitting: Anesthesiology

## 2014-12-21 ENCOUNTER — Encounter (HOSPITAL_COMMUNITY): Payer: Self-pay | Admitting: *Deleted

## 2014-12-21 ENCOUNTER — Inpatient Hospital Stay (HOSPITAL_COMMUNITY)
Admission: AD | Admit: 2014-12-21 | Discharge: 2014-12-22 | DRG: 775 | Disposition: A | Discharge status: Home / Self Care | Payer: 59 | Source: Ambulatory Visit | Attending: Obstetrics and Gynecology | Admitting: Obstetrics and Gynecology

## 2014-12-21 DIAGNOSIS — O133 Gestational [pregnancy-induced] hypertension without significant proteinuria, third trimester: Principal | ICD-10-CM | POA: Diagnosis present

## 2014-12-21 DIAGNOSIS — Z833 Family history of diabetes mellitus: Secondary | ICD-10-CM

## 2014-12-21 DIAGNOSIS — Z8249 Family history of ischemic heart disease and other diseases of the circulatory system: Secondary | ICD-10-CM | POA: Diagnosis not present

## 2014-12-21 DIAGNOSIS — O139 Gestational [pregnancy-induced] hypertension without significant proteinuria, unspecified trimester: Secondary | ICD-10-CM | POA: Diagnosis present

## 2014-12-21 DIAGNOSIS — Z3A39 39 weeks gestation of pregnancy: Secondary | ICD-10-CM | POA: Diagnosis present

## 2014-12-21 DIAGNOSIS — O09523 Supervision of elderly multigravida, third trimester: Secondary | ICD-10-CM

## 2014-12-21 LAB — COMPREHENSIVE METABOLIC PANEL
ALBUMIN: 3 g/dL — AB (ref 3.5–5.0)
ALK PHOS: 112 U/L (ref 38–126)
ALT: 13 U/L — ABNORMAL LOW (ref 14–54)
ANION GAP: 10 (ref 5–15)
AST: 23 U/L (ref 15–41)
BILIRUBIN TOTAL: 0.3 mg/dL (ref 0.3–1.2)
BUN: 12 mg/dL (ref 6–20)
CO2: 20 mmol/L — AB (ref 22–32)
Calcium: 8.4 mg/dL — ABNORMAL LOW (ref 8.9–10.3)
Chloride: 103 mmol/L (ref 101–111)
Creatinine, Ser: 0.71 mg/dL (ref 0.44–1.00)
GFR calc Af Amer: >60 mL/min (ref >60)
GFR calc non Af Amer: >60 mL/min (ref >60)
GLUCOSE: 168 mg/dL — AB (ref 65–99)
POTASSIUM: 3.9 mmol/L (ref 3.5–5.1)
SODIUM: 133 mmol/L — AB (ref 135–145)
TOTAL PROTEIN: 6.1 g/dL — AB (ref 6.5–8.1)

## 2014-12-21 LAB — URINALYSIS, ROUTINE W REFLEX MICROSCOPIC
BILIRUBIN URINE: NEGATIVE
Glucose, UA: NEGATIVE mg/dL
KETONES UR: NEGATIVE mg/dL
NITRITE: NEGATIVE
PH: 6 (ref 5.0–8.0)
Protein, ur: NEGATIVE mg/dL
SPECIFIC GRAVITY, URINE: 1.01 (ref 1.005–1.030)
UROBILINOGEN UA: 0.2 mg/dL (ref 0.0–1.0)

## 2014-12-21 LAB — CBC
HCT: 25.2 % — ABNORMAL LOW (ref 36.0–46.0)
HEMATOCRIT: 29 % — AB (ref 36.0–46.0)
Hemoglobin: 8.9 g/dL — ABNORMAL LOW (ref 12.0–15.0)
Hemoglobin: 7.9 g/dL — ABNORMAL LOW (ref 12.0–15.0)
MCH: 22.4 pg — AB (ref 26.0–34.0)
MCH: 22.9 pg — AB (ref 26.0–34.0)
MCHC: 30.7 g/dL (ref 30.0–36.0)
MCHC: 31.3 g/dL (ref 30.0–36.0)
MCV: 72.9 fL — AB (ref 78.0–100.0)
MCV: 73 fL — AB (ref 78.0–100.0)
PLATELETS: 155 10*3/uL (ref 150–400)
PLATELETS: 135 10*3/uL — AB (ref 150–400)
RBC: 3.98 MIL/uL (ref 3.87–5.11)
RBC: 3.45 MIL/uL — ABNORMAL LOW (ref 3.87–5.11)
RDW: 16.8 % — AB (ref 11.5–15.5)
RDW: 17 % — AB (ref 11.5–15.5)
WBC: 6.6 10*3/uL (ref 4.0–10.5)
WBC: 10.1 10*3/uL (ref 4.0–10.5)

## 2014-12-21 LAB — URINE MICROSCOPIC-ADD ON

## 2014-12-21 LAB — TYPE AND SCREEN
ABO/RH(D): O POS
Antibody Screen: NEGATIVE

## 2014-12-21 LAB — OB RESULTS CONSOLE GBS: GBS: NEGATIVE

## 2014-12-21 LAB — RPR: RPR Ser Ql: NONREACTIVE

## 2014-12-21 MED ORDER — WITCH HAZEL-GLYCERIN EX PADS
1.0000 "application " | MEDICATED_PAD | CUTANEOUS | Status: DC | PRN
  Administered 2014-12-22: 1 via TOPICAL

## 2014-12-21 MED ORDER — ZOLPIDEM TARTRATE 5 MG PO TABS: 5.0000 mg | ORAL_TABLET | Freq: Every evening | ORAL | Status: DC | PRN

## 2014-12-21 MED ORDER — ACETAMINOPHEN 325 MG PO TABS: 650.0000 mg | ORAL_TABLET | ORAL | Status: DC | PRN

## 2014-12-21 MED ORDER — DIPHENHYDRAMINE HCL 25 MG PO CAPS: 25.0000 mg | ORAL_CAPSULE | Freq: Four times a day (QID) | ORAL | Status: DC | PRN

## 2014-12-21 MED ORDER — OXYCODONE-ACETAMINOPHEN 5-325 MG PO TABS
1.0000 | ORAL_TABLET | ORAL | Status: DC | PRN
Start: 2014-12-21 — End: 2014-12-22

## 2014-12-21 MED ORDER — LACTATED RINGERS IV SOLN
INTRAVENOUS | Status: DC
  Administered 2014-12-21: 08:00:00 via INTRAVENOUS

## 2014-12-21 MED ORDER — OXYTOCIN BOLUS FROM INFUSION
500.0000 mL | INTRAVENOUS | Status: DC
  Administered 2014-12-21: 500 mL via INTRAVENOUS

## 2014-12-21 MED ORDER — SENNOSIDES-DOCUSATE SODIUM 8.6-50 MG PO TABS
2.0000 | ORAL_TABLET | ORAL | Status: DC
  Administered 2014-12-22: 2 via ORAL
  Filled 2014-12-21: qty 2

## 2014-12-21 MED ORDER — PANTOPRAZOLE SODIUM 40 MG PO TBEC
40.0000 mg | DELAYED_RELEASE_TABLET | Freq: Every day | ORAL | Status: DC
  Filled 2014-12-21: qty 1

## 2014-12-21 MED ORDER — OXYCODONE-ACETAMINOPHEN 5-325 MG PO TABS: 1.0000 | ORAL_TABLET | ORAL | Status: DC | PRN

## 2014-12-21 MED ORDER — LIDOCAINE HCL (PF) 1 % IJ SOLN
30.0000 mL | INTRAMUSCULAR | Status: DC | PRN
  Filled 2014-12-21: qty 30

## 2014-12-21 MED ORDER — EPHEDRINE 5 MG/ML INJ
10.0000 mg | INTRAVENOUS | Status: DC | PRN
  Filled 2014-12-21: qty 2

## 2014-12-21 MED ORDER — PRENATAL MULTIVITAMIN CH
1.0000 | ORAL_TABLET | Freq: Every day | ORAL | Status: DC
  Filled 2014-12-21: qty 1

## 2014-12-21 MED ORDER — LACTATED RINGERS IV SOLN: INTRAVENOUS | Status: DC

## 2014-12-21 MED ORDER — LANOLIN HYDROUS EX OINT: TOPICAL_OINTMENT | CUTANEOUS | Status: DC | PRN

## 2014-12-21 MED ORDER — OXYCODONE-ACETAMINOPHEN 5-325 MG PO TABS: 2.0000 | ORAL_TABLET | ORAL | Status: DC | PRN

## 2014-12-21 MED ORDER — FENTANYL 2.5 MCG/ML BUPIVACAINE 1/10 % EPIDURAL INFUSION (WH - ANES)
14.0000 mL/h | INTRAMUSCULAR | Status: DC | PRN
  Administered 2014-12-21 (×2): 14 mL/h via EPIDURAL
  Filled 2014-12-21: qty 125

## 2014-12-21 MED ORDER — CITRIC ACID-SODIUM CITRATE 334-500 MG/5ML PO SOLN: 30.0000 mL | ORAL | Status: DC | PRN

## 2014-12-21 MED ORDER — IBUPROFEN 600 MG PO TABS
600.0000 mg | ORAL_TABLET | Freq: Four times a day (QID) | ORAL | Status: DC
  Administered 2014-12-22 (×4): 600 mg via ORAL
  Filled 2014-12-21 (×4): qty 1

## 2014-12-21 MED ORDER — LIDOCAINE HCL (PF) 1 % IJ SOLN
INTRAMUSCULAR | Status: DC | PRN
  Administered 2014-12-21: 3 mL via EPIDURAL
  Administered 2014-12-21: 2 mL via EPIDURAL
  Administered 2014-12-21: 5 mL via EPIDURAL

## 2014-12-21 MED ORDER — TERBUTALINE SULFATE 1 MG/ML IJ SOLN
0.2500 mg | Freq: Once | INTRAMUSCULAR | Status: DC | PRN
  Filled 2014-12-21: qty 1

## 2014-12-21 MED ORDER — ONDANSETRON HCL 4 MG/2ML IJ SOLN: 4.0000 mg | INTRAMUSCULAR | Status: DC | PRN

## 2014-12-21 MED ORDER — DIBUCAINE 1 % RE OINT
1.0000 "application " | TOPICAL_OINTMENT | RECTAL | Status: DC | PRN
  Administered 2014-12-22: 1 via RECTAL
  Filled 2014-12-21: qty 28

## 2014-12-21 MED ORDER — OXYTOCIN 40 UNITS IN LACTATED RINGERS INFUSION - SIMPLE MED: 62.5000 mL/h | INTRAVENOUS | Status: DC

## 2014-12-21 MED ORDER — BENZOCAINE-MENTHOL 20-0.5 % EX AERO
1.0000 "application " | INHALATION_SPRAY | CUTANEOUS | Status: DC | PRN
  Administered 2014-12-22: 1 via TOPICAL
  Filled 2014-12-21: qty 56

## 2014-12-21 MED ORDER — BUTORPHANOL TARTRATE 1 MG/ML IJ SOLN: 1.0000 mg | INTRAMUSCULAR | Status: DC | PRN

## 2014-12-21 MED ORDER — SIMETHICONE 80 MG PO CHEW: 80.0000 mg | CHEWABLE_TABLET | ORAL | Status: DC | PRN

## 2014-12-21 MED ORDER — LACTATED RINGERS IV SOLN
500.0000 mL | INTRAVENOUS | Status: DC | PRN
Start: 2014-12-21 — End: 2014-12-21

## 2014-12-21 MED ORDER — PHENYLEPHRINE 40 MCG/ML (10ML) SYRINGE FOR IV PUSH (FOR BLOOD PRESSURE SUPPORT)
80.0000 ug | PREFILLED_SYRINGE | INTRAVENOUS | Status: DC | PRN
  Filled 2014-12-21: qty 2
  Filled 2014-12-21: qty 20

## 2014-12-21 MED ORDER — DIPHENHYDRAMINE HCL 50 MG/ML IJ SOLN: 12.5000 mg | INTRAMUSCULAR | Status: DC | PRN

## 2014-12-21 MED ORDER — ONDANSETRON HCL 4 MG PO TABS: 4.0000 mg | ORAL_TABLET | ORAL | Status: DC | PRN

## 2014-12-21 MED ORDER — OXYTOCIN 40 UNITS IN LACTATED RINGERS INFUSION - SIMPLE MED
1.0000 m[IU]/min | INTRAVENOUS | Status: DC
  Administered 2014-12-21: 2 m[IU]/min via INTRAVENOUS
  Filled 2014-12-21: qty 1000

## 2014-12-21 MED ORDER — ONDANSETRON HCL 4 MG/2ML IJ SOLN: 4.0000 mg | Freq: Four times a day (QID) | INTRAMUSCULAR | Status: DC | PRN

## 2014-12-21 NOTE — H&P (Signed)
Terri Larsen is a 34 y.o. female [phone] at 39+ with PIH, trace proteinuria, mildly elevated BP.  Otherwise uncomplicated prenatal care - except maternal AMA and poor OB hx - several miscarriages.  +FM, no LOF, no VB, occ ctx.  D/W pt IOL, inc r/b/a  Maternal Medical History:  Contractions: Frequency: irregular.    Fetal activity: Perceived fetal activity is normal.    Prenatal complications: PIH.     OB History    Gravida Para Term Preterm AB TAB SAB Ectopic Multiple Living   0 2 0 2 0 0 1    G1 SAB G2 40wk 8#5 female SVD G3 SAB G4 present  H/o abn pap last 05/25/14 HR HPV neg No STDs  Past Medical History  Diagnosis Date  . Anxiety     no meds  . GERD (gastroesophageal reflux disease)     middle school  . Abnormal Pap smear   . Vaginal Pap smear, abnormal    Past Surgical History  Procedure Laterality Date  . Colposcopy    . Wisdom tooth extraction     Family History: family history includes Alcohol abuse in her maternal aunt, maternal grandfather, and maternal uncle; Cancer in her maternal grandmother, paternal aunt, and paternal grandmother; Diabetes in her father and paternal grandfather; GER disease in her mother; Heart attack in her paternal grandfather; Heart disease in her paternal aunt, paternal grandfather, and paternal uncle; Hypertension in her father, paternal aunt, paternal grandfather, and paternal uncle; Seizures in her mother. Social History:  reports that she has never smoked. She has never used smokeless tobacco. She reports that she does not drink alcohol or use illicit drugs.RN [employer], married Meds Protonix, PNV, Zyrtec All NKDA   Prenatal Transfer Tool  Maternal Diabetes: No Genetic Screening: Normal Maternal Ultrasounds/Referrals: Normal Fetal Ultrasounds or other Referrals:  None Maternal Substance Abuse:  No Significant Maternal Medications:  None Significant Maternal Lab Results:  Lab values include: Group B Strep negative Other  Comments:  None  Review of Systems  Constitutional: Negative.   Eyes: Negative.   Respiratory: Negative.   Cardiovascular: Negative.   Gastrointestinal: Negative.   Genitourinary: Negative.   Musculoskeletal: Negative.   Skin: Negative.   Neurological: Positive for headaches.  Psychiatric/Behavioral: Negative.       There were no vitals taken for this visit. Maternal Exam:  Abdomen: Patient reports no abdominal tenderness. Fundal height is appropriate for gesation.   Estimated fetal weight is 8#.   Fetal presentation: vertex  Introitus: Normal vulva. Normal vagina.  Pelvis: adequate for delivery.   Cervix: Cervix evaluated by digital exam.     Physical Exam  Constitutional: She is oriented to person, place, and time. She appears well-developed and well-nourished.  HENT:  Head: Normocephalic and atraumatic.  Cardiovascular: Normal rate and regular rhythm.   Respiratory: Effort normal and breath sounds normal. No respiratory distress. She has no wheezes.  GI: Soft. Bowel sounds are normal. She exhibits no distension. There is no tenderness.  Musculoskeletal: Normal range of motion.  Neurological: She is alert and oriented to person, place, and time.  Skin: Skin is warm and dry.  Psychiatric: She has a normal mood and affect. Her behavior is normal.    Prenatal labs: ABO, Rh: O/Positive/-- (02/04 0000) Antibody: Negative (02/04 0000) Rubella: Immune (02/04 0000) RPR:   NR HBsAg: Negative (02/04 0000)  HIV: Non-reactive (02/04 0000)  GBS:   neg  Hgb 12.0/Plt 201K/Ur Cx Ecoli+/GC neg/Chl neg/Pap WNL/First Tri  and AFP WNL/ glucola 161 - 3hr GTT/panorama WNL/  [place] nl NT [place] nl anat, ant plac, female  Tdap 09/28/14  Assessment/Plan: [id number] at 39+ with PIH for IOL Monitor labs and BP IOL with AROM and pitocin gbbs neg Expect SVD  Bovard-Stuckert, Rector Devonshire 12/21/2014, 7:48 AM

## 2014-12-21 NOTE — Anesthesia Procedure Notes (Signed)
Epidural Patient location during procedure: OB  Staffing Anesthesiologist: Madiline Saffran EDWARD Performed by: anesthesiologist   Preanesthetic Checklist Completed: patient identified, pre-op evaluation, timeout performed, IV checked, risks and benefits discussed and monitors and equipment checked  Epidural Patient position: sitting Prep: DuraPrep Patient monitoring: blood pressure and continuous pulse ox Approach: midline Location: L3-L4 Injection technique: LOR air  Needle:  Needle type: Tuohy  Needle gauge: 17 G Needle length: 9 cm Needle insertion depth: 8 cm Catheter size: 19 Gauge Catheter at skin depth: 13 cm Test dose: negative and Other (1% Lidocaine)  Additional Notes Patient identified.  Risk benefits discussed including failed block, incomplete pain control, headache, nerve damage, paralysis, blood pressure changes, nausea, vomiting, reactions to medication both toxic or allergic, and postpartum back pain.  Patient expressed understanding and wished to proceed.  All questions were answered.  Sterile technique used throughout procedure and epidural site dressed with sterile barrier dressing. No paresthesia or other complications noted. The patient did not experience any signs of intravascular injection such as tinnitus or metallic taste in mouth nor signs of intrathecal spread such as rapid motor block. Please see nursing notes for vital signs. Reason for block:procedure for pain   

## 2014-12-21 NOTE — Anesthesia Preprocedure Evaluation (Addendum)
Anesthesia Evaluation  Patient identified by MRN, date of birth, ID band Patient awake    Reviewed: Allergy & Precautions, NPO status , Patient's Chart, lab work & pertinent test results  Airway Mallampati: III  TM Distance: >3 FB Neck ROM: Full    Dental  (+) Teeth Intact, Dental Advisory Given   Pulmonary neg pulmonary ROS,  breath sounds clear to auscultation  Pulmonary exam normal       Cardiovascular Exercise Tolerance: Good hypertension, Normal cardiovascular examRhythm:Regular Rate:Normal  PIH   Neuro/Psych PSYCHIATRIC DISORDERS Anxiety negative neurological ROS     GI/Hepatic Neg liver ROS, GERD-  ,  Endo/Other  negative endocrine ROS  Renal/GU negative Renal ROS     Musculoskeletal negative musculoskeletal ROS (+)   Abdominal   Peds  Hematology negative hematology ROS (+) Blood dyscrasia, anemia ,   Anesthesia Other Findings Day of surgery medications reviewed with the patient.  Reproductive/Obstetrics                            Anesthesia Physical Anesthesia Plan  ASA: II  Anesthesia Plan: Epidural   Post-op Pain Management:    Induction:   Airway Management Planned:   Additional Equipment:   Intra-op Plan:   Post-operative Plan:   Informed Consent: I have reviewed the patients History and Physical, chart, labs and discussed the procedure including the risks, benefits and alternatives for the proposed anesthesia with the patient or authorized representative who has indicated his/her understanding and acceptance.   Dental advisory given  Plan Discussed with:   Anesthesia Plan Comments: (Patient identified. Risks/Benefits/Options discussed with patient including but not limited to bleeding, infection, nerve damage, paralysis, failed block, incomplete pain control, headache, blood pressure changes, nausea, vomiting, reactions to medication both or allergic, itching and  postpartum back pain. Confirmed with bedside nurse the patient's most recent platelet count. Confirmed with patient that they are not currently taking any anticoagulation, have any bleeding history or any family history of bleeding disorders. Patient expressed understanding and wished to proceed. All questions were answered. )        Anesthesia Quick Evaluation

## 2014-12-21 NOTE — Progress Notes (Signed)
Patient ID: Terri Larsen, female   DOB: 06/29/1980, 34 y.o.   MRN: [id number]  Reviewed H&P, no changes  AFVSS FHTs 140's, R, good var, category 1 toco irr  Attempt AROM, unsuccessful SVE 3/30/-3  Pitocin, AROM at lunch

## 2014-12-21 NOTE — Progress Notes (Signed)
Patient ID: Terri Larsen, female   DOB: Jan 09, 1981, 34 y.o.   MRN: [id number]  Some discomfort with ctx  AFVSS gen NAD FHTs 150's, mod/good var, category 1 toco Q 2-3 min AROM with SVE, clear fluid  33yo G4P1021 at 39+ with PIH for IOL Continue IOL, expect SVD

## 2014-12-22 ENCOUNTER — Encounter (HOSPITAL_COMMUNITY): Payer: Self-pay

## 2014-12-22 LAB — CBC
HEMATOCRIT: 25.9 % — AB (ref 36.0–46.0)
Hemoglobin: 7.9 g/dL — ABNORMAL LOW (ref 12.0–15.0)
MCH: 22.3 pg — ABNORMAL LOW (ref 26.0–34.0)
MCHC: 30.5 g/dL (ref 30.0–36.0)
MCV: 73.2 fL — ABNORMAL LOW (ref 78.0–100.0)
PLATELETS: 147 10*3/uL — AB (ref 150–400)
RBC: 3.54 MIL/uL — ABNORMAL LOW (ref 3.87–5.11)
RDW: 17 % — AB (ref 11.5–15.5)
WBC: 7.6 10*3/uL (ref 4.0–10.5)

## 2014-12-22 MED ORDER — IBUPROFEN 800 MG PO TABS: 800.0000 mg | ORAL_TABLET | Freq: Three times a day (TID) | ORAL | Status: AC | PRN

## 2014-12-22 MED ORDER — OXYCODONE-ACETAMINOPHEN 5-325 MG PO TABS: 1.0000 | ORAL_TABLET | Freq: Four times a day (QID) | ORAL | Status: AC | PRN

## 2014-12-22 MED ORDER — PRENATAL MULTIVITAMIN CH: 1.0000 | ORAL_TABLET | Freq: Every day | ORAL | Status: AC

## 2014-12-22 NOTE — Progress Notes (Addendum)
Post Partum Day 1 Subjective: no complaints, up ad lib, voiding, tolerating PO and nl lochia, pain controlled  Objective: Blood pressure 135/82, pulse 90, temperature 97.9 F (36.6 C), temperature source Oral, resp. rate 20, height 5' 5.5" (1.664 m), weight 104.327 kg (230 lb), unknown if currently breastfeeding.  Physical Exam:  General: alert and no distress Lochia: appropriate Uterine Fundus: firm   Recent Labs  12/21/14 0750 12/21/14 1900  HGB 8.9* 7.9*  HCT 29.0* 25.2*    Assessment/Plan: Plan for discharge tomorrow, Breastfeeding and Lactation consult.  Routine care.  Desires d/c today, will d/c with motrin, perocet and PNV, f/u 6 weeks   LOS: 1 day   Bovard-Stuckert, Terri Larsen 12/22/2014, 6:52 AM

## 2014-12-22 NOTE — Discharge Summary (Signed)
Obstetric Discharge Summary Reason for Admission: induction of labor and PIH Prenatal Procedures: none Intrapartum Procedures: spontaneous vaginal delivery Postpartum Procedures: none Complications-Operative and Postpartum: 2nd degree perineal laceration HEMOGLOBIN  Date Value Ref Range Status  12/22/2014 7.9* 12.0 - 15.0 g/dL Final   HCT  Date Value Ref Range Status  12/22/2014 25.9* 36.0 - 46.0 % Final    Physical Exam:  General: alert and no distress Lochia: appropriate Uterine Fundu s: firm  Discharge Diagnoses: Term Pregnancy-delivered  Discharge Information: Date: 12/22/2014 Activity: pelvic rest Diet: routine Medications: PNV, Ibuprofen and Percocet Condition: stable Instructions: refer to practice specific booklet Discharge to: home Follow-up Information    Follow up with Bovard-Stuckert, Lyndsie Wallman, MD. Schedule an appointment as soon as possible for a visit in 6 weeks.   Specialty:  Obstetrics and Gynecology   Why:  for postpartum check   Contact information:   [address] [place] [address] [phone]       Newborn Data: Live born female  Birth Weight: 7 lb 4.4 oz (3300 g) APGAR: 9, 9  Home with mother.  Bovard-Stuckert, Eliza Grissinger 12/22/2014, 8:15 AM

## 2014-12-22 NOTE — Anesthesia Postprocedure Evaluation (Signed)
Anesthesia Post Note  Patient: Terri Larsen  Procedure(s) Performed: * No procedures listed *  Anesthesia type: Epidural  Patient location: Mother/Baby  Post pain: Pain level controlled  Post assessment: Post-op Vital signs reviewed  Last Vitals:  Filed Vitals:   12/22/14 0610  BP: 127/82  Pulse: 90  Temp: 36.6 C  Resp: 18    Post vital signs: Reviewed  Level of consciousness:alert  Complications: No apparent anesthesia complications

## 2014-12-22 NOTE — Lactation Note (Signed)
This note was copied from the chart of Boy Bertie Mcconathy. Lactation Consultation Note  Patient Name: Boy Kailly Richoux [id number] Date: 12/22/2014 Reason for consult: Follow-up assessment   Initial consult at 20 hours old; chart updated by [facility].  GA 39.3; Bw 7 lbs, 4.4 oz.  1% weight loss at 7 hours old with last nights midnight weight check.  Infant was circumcised this am (~0900) and has been sleepy since he returned to mom's room. 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  Mom reports she pumped and bottled EBM for 3 months.  Reports the most she was able to pump was 2 oz from each breast - total of 4 oz max at a feeding.   Mom asked LC to assess this baby's mouth for any frenulum issues.  Upon assessment, upper lip frenulum is present, thin, does allow for flanging but is uncomfortable to baby, and does not extend to tip of gum line.  Sublingual frenulum is posterior in location, semi short, and blanches with lifting tongue.  Infant cannot lateralize his tongue but instead rolls his tongue when following a gloved finger.  Discussed findings with parents.   Reviewed hand expression with mom with return demonstration and observation of colostrum easily expressed.   Infant has breastfed x5 (15-35 min) since birth 20 hours ago; voids-2; stools-3.  LC observed a large meconium stools during consult.  After infant stooled, he awoke, and mom asked LC to observe a latch. Mom brought her own #24 nipple shield from home (appropriate size for mom's nipple).  Mom has semi-flat nipples but they do erect with stimulation.  Mom has been wearing shells inside bra and using olive oil ointment on nipples for soreness.  Comfort gels also given and explained how to use.  Mom has a DEBP set up in room and had pumped 5 ml EBM that she planned to feed  infant later.  Mom asked for DEBP to be set up d/t history of low milk supply and since infant went for circumcision this am. LC showed mom how to properly apply nipple shield.  Mom allowed infant to self latch in football hold with her bringing breast-to-baby.  LC showed mom how to provide support to infant's torso for proper latching, sandwiching breast, and latching with asymmetrical latching technique.  Taught dad how to assist with teacup hold and flanging bottom lip.  LC encouraged mom to try latching without the nipple shield.  After a few on/off attempts infant latched and stayed latched on breast without nipple shield.  FOB assisted with latching infant and actively involved with breastfeeding assistance.  LS-8.  Several swallows heard.   Mom has DEBP at home for home use.  Encouraged mom to pump for extra stimulation twice a day and for EBM supplementation if needed.   Parents plan to follow-up with a DDS and with Peds regarding further frenulum examination by an expert and follow-up advice.  LC encouraged mom to monitor infant behavior and pain with breastfeeding watching latches for depth and flanged lips. Pediatrician in to see mom while LC was in room and discussed breastfeeding progress with mom's comfort with breastfeeding, frequent feeding, and mom's ability to hand express and pump EBM.   Mom to be discharged this evening after all newborn screens completed per Peds. Infant has follow-up with  OP Peds on Tuesday in 4 days (d/t weekend and holiday). [facility] brochure given and informed of outpatient services and hospital support group.  Encouraged to call for questions as needed after discharge.      Maternal Data Formula Feeding for Exclusion: No Has patient been taught Hand Expression?: Yes Does the patient have breastfeeding experience prior to this delivery?: Yes  Feeding Feeding Type: Breast Fed  LATCH Score/Interventions Latch: Grasps breast easily, tongue down, lips flanged,  rhythmical sucking.  Audible Swallowing: Spontaneous and intermittent  Type of Nipple: Everted at rest and after stimulation (using shells for semi-flat nipples)  Comfort (Breast/Nipple): Filling, red/small blisters or bruises, mild/mod discomfort  Problem noted: Mild/Moderate discomfort Interventions (Mild/moderate discomfort): Comfort gels;Breast shields;Hand expression  Hold (Positioning): Assistance needed to correctly position infant at breast and maintain latch. Intervention(s): Breastfeeding basics reviewed;Support Pillows;Skin to skin  LATCH Score: 8  Lactation Tools Discussed/Used     Consult Status Consult Status: Complete    Lendon Ka 12/22/2014, 2:35 PM

## 2016-06-08 ENCOUNTER — Encounter (HOSPITAL_BASED_OUTPATIENT_CLINIC_OR_DEPARTMENT_OTHER): Payer: Self-pay | Admitting: *Deleted

## 2016-06-08 ENCOUNTER — Emergency Department (HOSPITAL_BASED_OUTPATIENT_CLINIC_OR_DEPARTMENT_OTHER)
Admission: EM | Admit: 2016-06-08 | Discharge: 2016-06-09 | Disposition: A | Discharge status: Home / Self Care | Payer: 59 | Attending: Emergency Medicine | Admitting: Emergency Medicine

## 2016-06-08 ENCOUNTER — Emergency Department (HOSPITAL_BASED_OUTPATIENT_CLINIC_OR_DEPARTMENT_OTHER): Payer: 59

## 2016-06-08 DIAGNOSIS — J189 Pneumonia, unspecified organism: Secondary | ICD-10-CM | POA: Insufficient documentation

## 2016-06-08 DIAGNOSIS — Z79899 Other long term (current) drug therapy: Secondary | ICD-10-CM | POA: Insufficient documentation

## 2016-06-08 DIAGNOSIS — R112 Nausea with vomiting, unspecified: Secondary | ICD-10-CM | POA: Insufficient documentation

## 2016-06-08 DIAGNOSIS — R197 Diarrhea, unspecified: Secondary | ICD-10-CM | POA: Insufficient documentation

## 2016-06-08 DIAGNOSIS — R110 Nausea: Secondary | ICD-10-CM | POA: Diagnosis present

## 2016-06-08 LAB — URINALYSIS, ROUTINE W REFLEX MICROSCOPIC
Bilirubin Urine: NEGATIVE
Glucose, UA: NEGATIVE mg/dL
Hgb urine dipstick: NEGATIVE
Ketones, ur: NEGATIVE mg/dL
Nitrite: NEGATIVE
PROTEIN: NEGATIVE mg/dL
Specific Gravity, Urine: 1.003 — ABNORMAL LOW (ref 1.005–1.030)
pH: 6.5 (ref 5.0–8.0)

## 2016-06-08 LAB — CBC WITH DIFFERENTIAL/PLATELET
BASOS PCT: 0 %
Basophils Absolute: 0 10*3/uL (ref 0.0–0.1)
EOS ABS: 0 10*3/uL (ref 0.0–0.7)
Eosinophils Relative: 0 %
HEMATOCRIT: 38 % (ref 36.0–46.0)
Hemoglobin: 12.4 g/dL (ref 12.0–15.0)
LYMPHS ABS: 0.8 10*3/uL (ref 0.7–4.0)
Lymphocytes Relative: 12 %
MCH: 24.9 pg — ABNORMAL LOW (ref 26.0–34.0)
MCHC: 32.6 g/dL (ref 30.0–36.0)
MCV: 76.3 fL — ABNORMAL LOW (ref 78.0–100.0)
Monocytes Absolute: 0.6 10*3/uL (ref 0.1–1.0)
Monocytes Relative: 9 %
NEUTROS ABS: 5.2 10*3/uL (ref 1.7–7.7)
Neutrophils Relative %: 79 %
Platelets: 195 10*3/uL (ref 150–400)
RBC: 4.98 MIL/uL (ref 3.87–5.11)
RDW: 15.2 % (ref 11.5–15.5)
WBC: 6.6 10*3/uL (ref 4.0–10.5)

## 2016-06-08 LAB — COMPREHENSIVE METABOLIC PANEL
ALT: 27 U/L (ref 14–54)
ANION GAP: 9 (ref 5–15)
AST: 25 U/L (ref 15–41)
Albumin: 3.8 g/dL (ref 3.5–5.0)
Alkaline Phosphatase: 60 U/L (ref 38–126)
BUN: 7 mg/dL (ref 6–20)
CALCIUM: 8.3 mg/dL — AB (ref 8.9–10.3)
CHLORIDE: 99 mmol/L — AB (ref 101–111)
CO2: 25 mmol/L (ref 22–32)
CREATININE: 0.86 mg/dL (ref 0.44–1.00)
Glucose, Bld: 120 mg/dL — ABNORMAL HIGH (ref 65–99)
Potassium: 3 mmol/L — ABNORMAL LOW (ref 3.5–5.1)
SODIUM: 133 mmol/L — AB (ref 135–145)
Total Bilirubin: 0.8 mg/dL (ref 0.3–1.2)
Total Protein: 7.4 g/dL (ref 6.5–8.1)

## 2016-06-08 LAB — D-DIMER, QUANTITATIVE (NOT AT ARMC): D DIMER QUANT: 0.74 ug{FEU}/mL — AB (ref 0.00–0.50)

## 2016-06-08 LAB — PREGNANCY, URINE: PREG TEST UR: NEGATIVE

## 2016-06-08 LAB — URINALYSIS, MICROSCOPIC (REFLEX)

## 2016-06-08 MED ORDER — SODIUM CHLORIDE 0.9 % IV BOLUS (SEPSIS)
1000.0000 mL | Freq: Once | INTRAVENOUS | Status: AC
  Administered 2016-06-08: 1000 mL via INTRAVENOUS

## 2016-06-08 MED ORDER — IOPAMIDOL (ISOVUE-370) INJECTION 76%
100.0000 mL | Freq: Once | INTRAVENOUS | Status: AC | PRN
  Administered 2016-06-08: 100 mL via INTRAVENOUS

## 2016-06-08 MED ORDER — ONDANSETRON 4 MG PO TBDP: 4.0000 mg | ORAL_TABLET | Freq: Three times a day (TID) | ORAL | 0 refills | Status: AC | PRN

## 2016-06-08 MED ORDER — LEVOFLOXACIN 500 MG PO TABS
500.0000 mg | ORAL_TABLET | Freq: Once | ORAL | Status: AC
  Administered 2016-06-08: 500 mg via ORAL
  Filled 2016-06-08: qty 1

## 2016-06-08 MED ORDER — ACETAMINOPHEN 500 MG PO TABS
1000.0000 mg | ORAL_TABLET | Freq: Once | ORAL | Status: AC
  Administered 2016-06-08: 1000 mg via ORAL
  Filled 2016-06-08: qty 2

## 2016-06-08 MED ORDER — ONDANSETRON HCL 4 MG/2ML IJ SOLN
4.0000 mg | Freq: Once | INTRAMUSCULAR | Status: AC
  Administered 2016-06-08: 4 mg via INTRAVENOUS
  Filled 2016-06-08: qty 2

## 2016-06-08 MED ORDER — POTASSIUM CHLORIDE 10 MEQ/100ML IV SOLN
10.0000 meq | Freq: Once | INTRAVENOUS | Status: AC
  Administered 2016-06-08: 10 meq via INTRAVENOUS
  Filled 2016-06-08: qty 100

## 2016-06-08 MED ORDER — LEVOFLOXACIN 500 MG PO TABS: 500.0000 mg | ORAL_TABLET | Freq: Every day | ORAL | 0 refills | Status: AC

## 2016-06-08 NOTE — ED Notes (Signed)
ED Provider at bedside. 

## 2016-06-08 NOTE — ED Notes (Signed)
Patient transported to X-ray 

## 2016-06-08 NOTE — ED Triage Notes (Signed)
Cough x 1 month. CXR "clr" Treated for sinus infection with Cefidir and methylprednisolone. Friday night developed N/V. No diarrhea until today. Self medicated with old Zofran and old Phenergan. Stopped throwing up temporarily, but s/s returned today. Seen at UC earlier today. Told to come to ED for increased HR and fluids.

## 2016-06-08 NOTE — ED Notes (Signed)
Ambulatory back from b/r, steady gait, reports episode of diarrhea, denies pain, HA only with cough, nausea better but remains (mild), "feel about the same". Alert, NAD, calm, interactive, resps e/u, speaking in clear complete sentences, no dyspnea noted, skin W&D, VSS, (denies: pain, sob, dizziness). [facility]

## 2016-06-08 NOTE — ED Notes (Signed)
Pt states she just used the restroom, but forgot to obtain a urine specimen.

## 2016-06-08 NOTE — Discharge Instructions (Signed)
Please take the Levaquin, antibiotic as prescribed. Additionally use Zofran for nausea. And please continue your steroids that you have at home.  Please return with increased shortness of breath or dehydration or other concerns.

## 2016-06-08 NOTE — ED Provider Notes (Signed)
[facility] Provider Note   CSN: [id number] Arrival date & time: 06/08/16  1750   By signing my name below, I, Terri Larsen, attest that this documentation has been prepared under the direction and in the presence of [name], MD. Electronically Signed: Soijett Larsen, ED Scribe. 06/08/16. 7:23 PM.  History   Chief Complaint Chief Complaint  Patient presents with  . Nausea    HPI Terri Larsen is a 36 y.o. female who presents to the Emergency Department complaining of nausea onset 2 days ago. Pt reports associated resolved vomiting, diarrhea, increased productive cough, and max fever of 99-100. Pt has tried Rx zofran and phenergan with relief of her symptoms. Pt notes that she has had a cough x 1 month and was evaluated 5 days ago and treated with cefdinir and methylprednisolone for a sinus infection. Pt states that she went to Urgent Care this morning for IV fluids and was informed to come into the ED for further evaluation due to elevated heart rate. She denies chills and any other symptoms.     The history is provided by the patient. No language interpreter was used.    Past Medical History:  Diagnosis Date  . Abnormal Pap smear   . Anxiety    no meds  . GERD (gastroesophageal reflux disease)    middle school  . SVD (spontaneous vaginal delivery) 12/21/2014  . Vaginal Pap smear, abnormal     Patient Active Problem List   Diagnosis Date Noted  . PIH (pregnancy induced hypertension) 12/21/2014  . SVD (spontaneous vaginal delivery) 12/21/2014    Past Surgical History:  Procedure Laterality Date  . COLPOSCOPY    . WISDOM TOOTH EXTRACTION      OB History    Gravida Para Term Preterm AB Living   4 2 2  0 2 2   SAB TAB Ectopic Multiple Live Births   2 0 0 0 2       Home Medications    Prior to Admission medications   Medication Sig Start Date End Date Taking? Authorizing Provider  fexofenadine (ALLEGRA) 30 MG tablet Take 30 mg by mouth 2  (two) times daily.   Yes Historical Provider, MD  cetirizine (ZYRTEC) 10 MG tablet Take 10 mg by mouth daily.    Historical Provider, MD  ibuprofen (ADVIL,MOTRIN) 800 MG tablet Take 1 tablet (800 mg total) by mouth every 8 (eight) hours as needed. 12/22/14   Sherian Rein, MD  levofloxacin (LEVAQUIN) 500 MG tablet Take 1 tablet (500 mg total) by mouth daily. 06/08/16   [name] [name], MD  ondansetron (ZOFRAN ODT) 4 MG disintegrating tablet Take 1 tablet (4 mg total) by mouth every 8 (eight) hours as needed for nausea or vomiting. 06/08/16   [name] Aldine Grainger, MD  oxyCODONE-acetaminophen (PERCOCET/ROXICET) 5-325 MG per tablet Take 1-2 tablets by mouth every 6 (six) hours as needed for severe pain. 12/22/14   [name], MD  pantoprazole (PROTONIX) 40 MG tablet Take 40 mg by mouth daily.      Historical Provider, MD  Prenatal Vit-Fe Fumarate-FA (PRENATAL MULTIVITAMIN) TABS tablet Take 1 tablet by mouth daily. 12/22/14   Sherian Rein, MD    Family History Family History  Problem Relation Age of Onset  . GER disease Mother   . Seizures Mother     as child  . Diabetes Father     type 2  . Hypertension Father   . Alcohol abuse Maternal Aunt   . Alcohol abuse  Maternal Uncle   . Cancer Paternal Aunt     breast  . Heart disease Paternal Aunt   . Hypertension Paternal Aunt   . Heart disease Paternal Uncle   . Hypertension Paternal Uncle   . Cancer Maternal Grandmother   . Alcohol abuse Maternal Grandfather   . Cancer Paternal Grandmother     breast  . Diabetes Paternal Grandfather     type 2  . Heart disease Paternal Grandfather   . Hypertension Paternal Grandfather   . Heart attack Paternal Grandfather     Social History Social History  Substance Use Topics  . Smoking status: Never Smoker  . Smokeless tobacco: Never Used  . Alcohol use No     Allergies   Patient has no known allergies.   Review of Systems Review of Systems  Constitutional:  Positive for fever. Negative for chills.  Respiratory: Positive for cough.   Gastrointestinal: Positive for diarrhea, nausea and vomiting.     Physical Exam Updated Vital Signs BP 124/76   Pulse 105   Temp 98.5 F (36.9 C) (Oral)   Resp 16   Ht 5\' 5"  (1.651 m)   Wt 208 lb (94.3 kg)   LMP 06/01/2016 (Exact Date)   SpO2 99%   BMI 34.61 kg/m   Physical Exam  Constitutional: She is oriented to person, place, and time. She appears well-developed and well-nourished. No distress.  HENT:  Head: Normocephalic and atraumatic.  Eyes: EOM are normal.  Neck: Neck supple.  Cardiovascular: Normal rate, regular rhythm, normal heart sounds and intact distal pulses.  Exam reveals no gallop and no friction rub.   No murmur heard. Pulmonary/Chest: Effort normal and breath sounds normal. No respiratory distress. She has no wheezes. She has no rales.  Abdominal: She exhibits no distension.  Musculoskeletal: Normal range of motion.  Neurological: She is alert and oriented to person, place, and time.  Skin: Skin is warm and dry.  Psychiatric: She has a normal mood and affect. Her behavior is normal.  Nursing note and vitals reviewed.    ED Treatments / Results  DIAGNOSTIC STUDIES: Oxygen Saturation is 97% on RA, nl by my interpretation.    COORDINATION OF CARE: 7:09 PM Discussed treatment plan with pt at bedside which includes CXR, IV fluids, zofran, labs, tamiflu Rx, and pt agreed to plan.   Labs (all labs ordered are listed, but only abnormal results are displayed) Labs Reviewed  COMPREHENSIVE METABOLIC PANEL - Abnormal; Notable for the following:       Result Value   Sodium 133 (*)    Potassium 3.0 (*)    Chloride 99 (*)    Glucose, Bld 120 (*)    Calcium 8.3 (*)    All other components within normal limits  CBC WITH DIFFERENTIAL/PLATELET - Abnormal; Notable for the following:    MCV 76.3 (*)    MCH 24.9 (*)    All other components within normal limits  URINALYSIS, ROUTINE W  REFLEX MICROSCOPIC - Abnormal; Notable for the following:    APPearance CLOUDY (*)    Specific Gravity, Urine 1.003 (*)    Leukocytes, UA TRACE (*)    All other components within normal limits  URINALYSIS, MICROSCOPIC (REFLEX) - Abnormal; Notable for the following:    Bacteria, UA FEW (*)    Squamous Epithelial / LPF 6-30 (*)    All other components within normal limits  D-DIMER, QUANTITATIVE (NOT AT [facility]) - Abnormal; Notable for the following:    D-Dimer, Quant  0.74 (*)    All other components within normal limits  C DIFFICILE QUICK SCREEN W PCR REFLEX  GASTROINTESTINAL PANEL BY PCR, STOOL (REPLACES STOOL CULTURE)  PREGNANCY, URINE    EKG  EKG Interpretation None       Radiology Dg Chest 2 View  Result Date: 06/08/2016 CLINICAL DATA:  36 year old female with chest pain cough nausea vomiting and diarrhea for 3-4 days. Initial encounter. EXAM: CHEST  2 VIEW COMPARISON:  None. FINDINGS: Low normal lung volumes. Normal cardiac size and mediastinal contours. Visualized tracheal air column is within normal limits. No pneumothorax, pulmonary edema, pleural effusion or confluent pulmonary opacity. There is mildly increased interstitial opacity in both lungs. Negative visible bowel gas pattern. No acute osseous abnormality identified. IMPRESSION: Negative chest except for mildly increased pulmonary interstitial markings which could reflect viral or atypical respiratory infection. Electronically Signed   By: Odessa FlemingH  Hall M.D.   On: 06/08/2016 19:56    Procedures Procedures (including critical care time)  Medications Ordered in ED Medications  sodium chloride 0.9 % bolus 1,000 mL (not administered)  levofloxacin (LEVAQUIN) tablet 500 mg (not administered)  sodium chloride 0.9 % bolus 1,000 mL (0 mLs Intravenous Stopped 06/08/16 2144)  sodium chloride 0.9 % bolus 1,000 mL (0 mLs Intravenous Stopped 06/08/16 1955)  ondansetron (ZOFRAN) injection 4 mg (4 mg Intravenous Given 06/08/16 1955)    potassium chloride 10 mEq in 100 mL IVPB (0 mEq Intravenous Stopped 06/08/16 2144)  iopamidol (ISOVUE-370) 76 % injection 100 mL (100 mLs Intravenous Contrast Given 06/08/16 2321)     Initial Impression / Assessment and Plan / ED Course  I have reviewed the triage vital signs and the nursing notes.  Pertinent labs & imaging results that were available during my care of the patient were reviewed by me and considered in my medical decision making (see chart for details).     Patient is well-appearing 36 year old female presenting with over week longhistory of cough congestion fever then nausea and vomiting starting today. Patient had mild fevers for last 2 days. Patient reports that she's try to stay hydrated at home but was unable to. She went to urgent care who sent her here for fluids. Patient does have a tachycardia. Will get a chest x-ray to make sure she has not developed pneumonia since her last chest x-ray on Monday. I suspect that she has the flu. We discussed the benefits and risks of Tamiflu. We'll get we'll give fluids, by mouth challenged patient and give her supportive care at home.  11:25 PM Gave two fluids with residual tachycardia.  Will get d dimer given tachycardai, cough SOB and recent immbolity due to illness.  D dimer +, will get CT angio.  Plan to treat atypical pna seen on Xray with levaquin, have her continue steroids at home and use zofran for symtpoms.     I personally performed the services described in this documentation, which was scribed in my presence. The recorded information has been reviewed and is accurate.     Final Clinical Impressions(s) / ED Diagnoses   Final diagnoses:  Community acquired pneumonia, unspecified laterality    New Prescriptions New Prescriptions   LEVOFLOXACIN (LEVAQUIN) 500 MG TABLET    Take 1 tablet (500 mg total) by mouth daily.   ONDANSETRON (ZOFRAN ODT) 4 MG DISINTEGRATING TABLET    Take 1 tablet (4 mg total) by mouth  every 8 (eight) hours as needed for nausea or vomiting.     Yuan Gann Randall AnLyn Lathon Adan, MD  06/08/16 2325  

## 2016-06-09 LAB — GASTROINTESTINAL PANEL BY PCR, STOOL (REPLACES STOOL CULTURE)
ADENOVIRUS F40/41: NOT DETECTED
Astrovirus: NOT DETECTED
CAMPYLOBACTER SPECIES: NOT DETECTED
CRYPTOSPORIDIUM: NOT DETECTED
Cyclospora cayetanensis: NOT DETECTED
ENTEROPATHOGENIC E COLI (EPEC): NOT DETECTED
ENTEROTOXIGENIC E COLI (ETEC): NOT DETECTED
Entamoeba histolytica: NOT DETECTED
Enteroaggregative E coli (EAEC): NOT DETECTED
Giardia lamblia: NOT DETECTED
Norovirus GI/GII: NOT DETECTED
PLESIMONAS SHIGELLOIDES: NOT DETECTED
ROTAVIRUS A: NOT DETECTED
SHIGELLA/ENTEROINVASIVE E COLI (EIEC): NOT DETECTED
Salmonella species: NOT DETECTED
Sapovirus (I, II, IV, and V): DETECTED — AB
Shiga like toxin producing E coli (STEC): NOT DETECTED
Vibrio cholerae: NOT DETECTED
Vibrio species: NOT DETECTED
YERSINIA ENTEROCOLITICA: NOT DETECTED

## 2016-06-09 LAB — C DIFFICILE QUICK SCREEN W PCR REFLEX
C DIFFICILE (CDIFF) INTERP: DETECTED
C Diff antigen: POSITIVE — AB
C Diff toxin: POSITIVE — AB

## 2016-06-09 NOTE — ED Notes (Signed)
Pt updated on CT results

## 2016-06-09 NOTE — ED Notes (Signed)
Pt given d/c instructions as per chart. Rx x 2. Verbalizes understanding. No questions. 

## 2018-05-11 IMAGING — DX DG CHEST 2V
2 series · 2 of 2 positions shown · non-contrast
Comparison: None.

CLINICAL DATA: 35-year-old female with chest pain cough nausea
vomiting and diarrhea for 3-4 days. Initial encounter.

EXAM:
CHEST  2 VIEW

[chest pa]
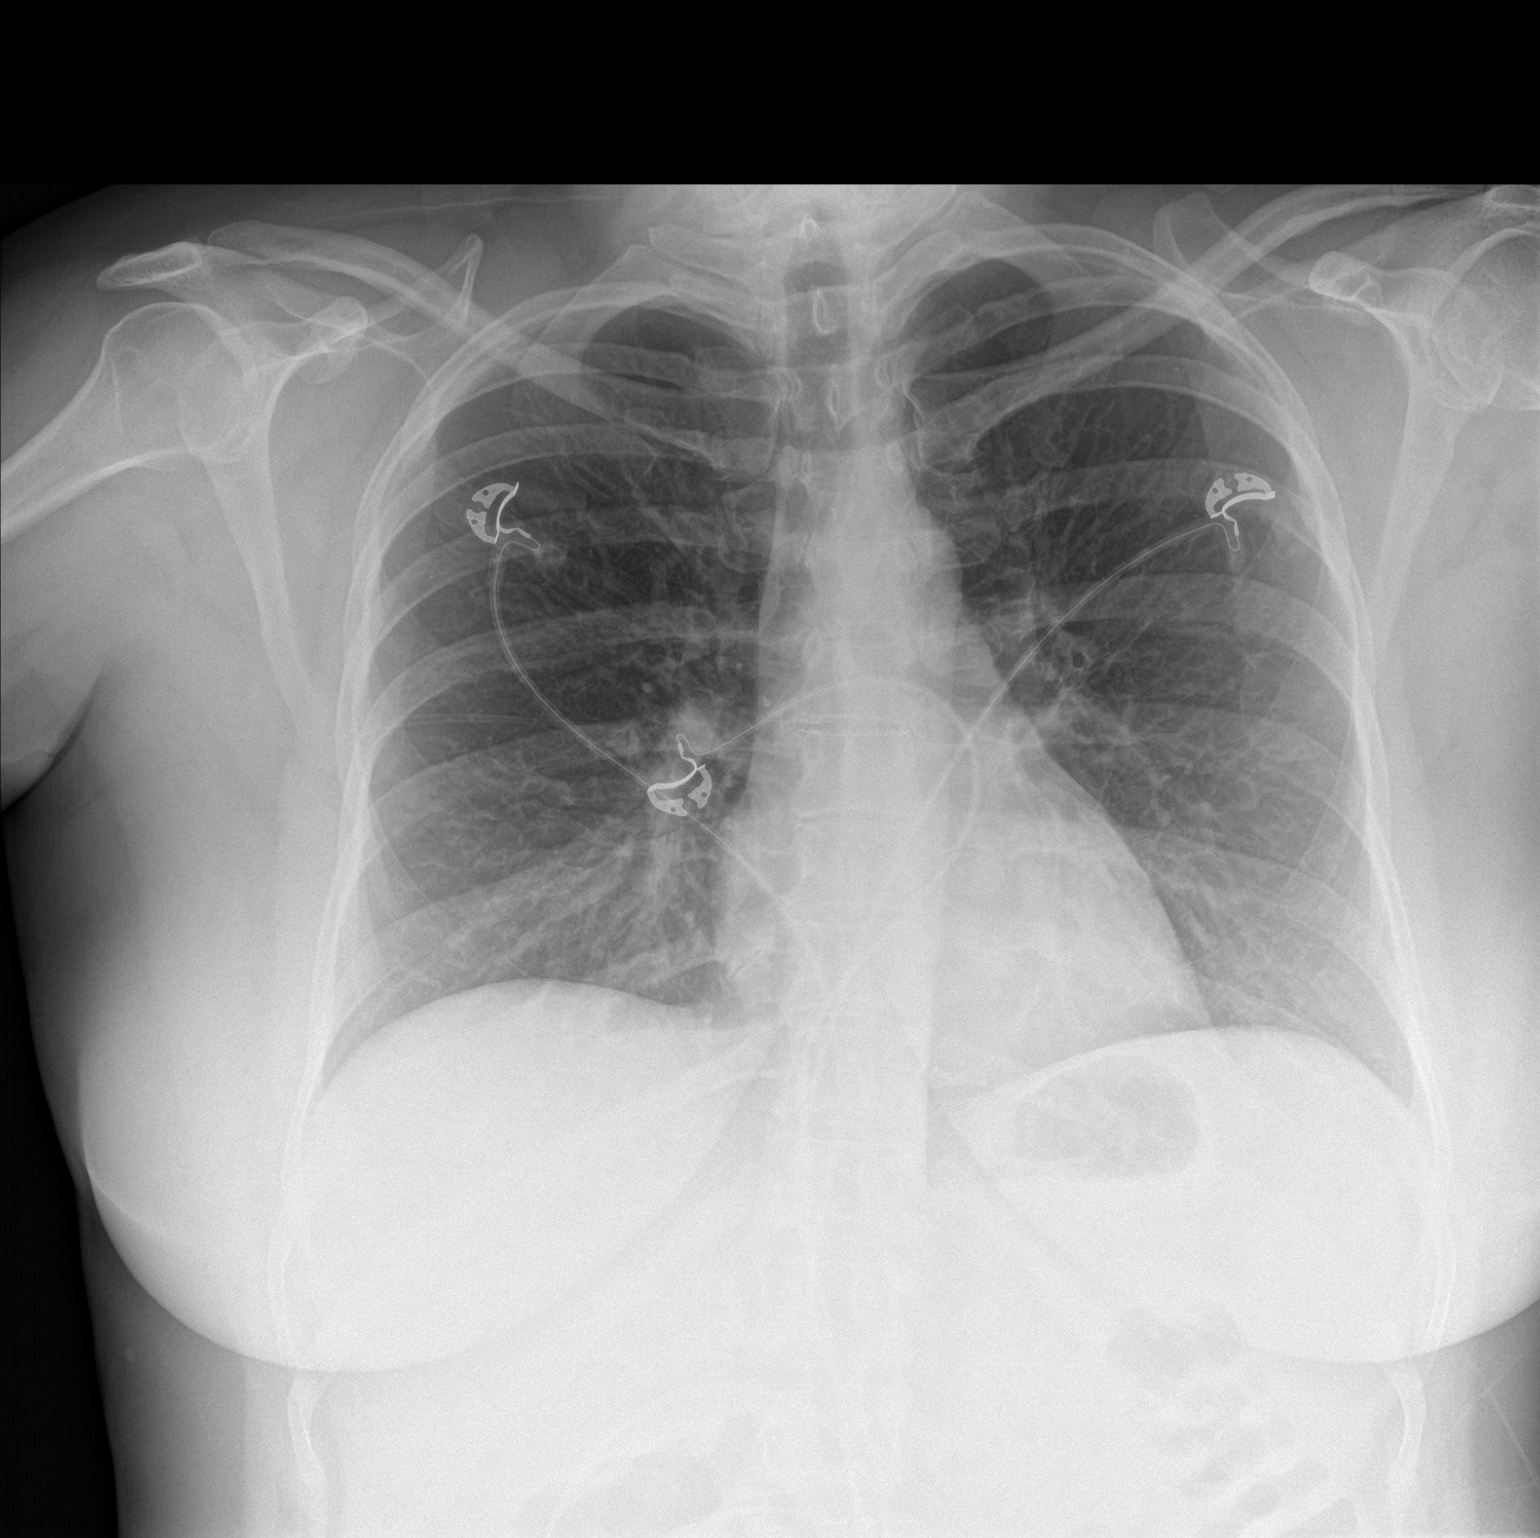

[chest lat]
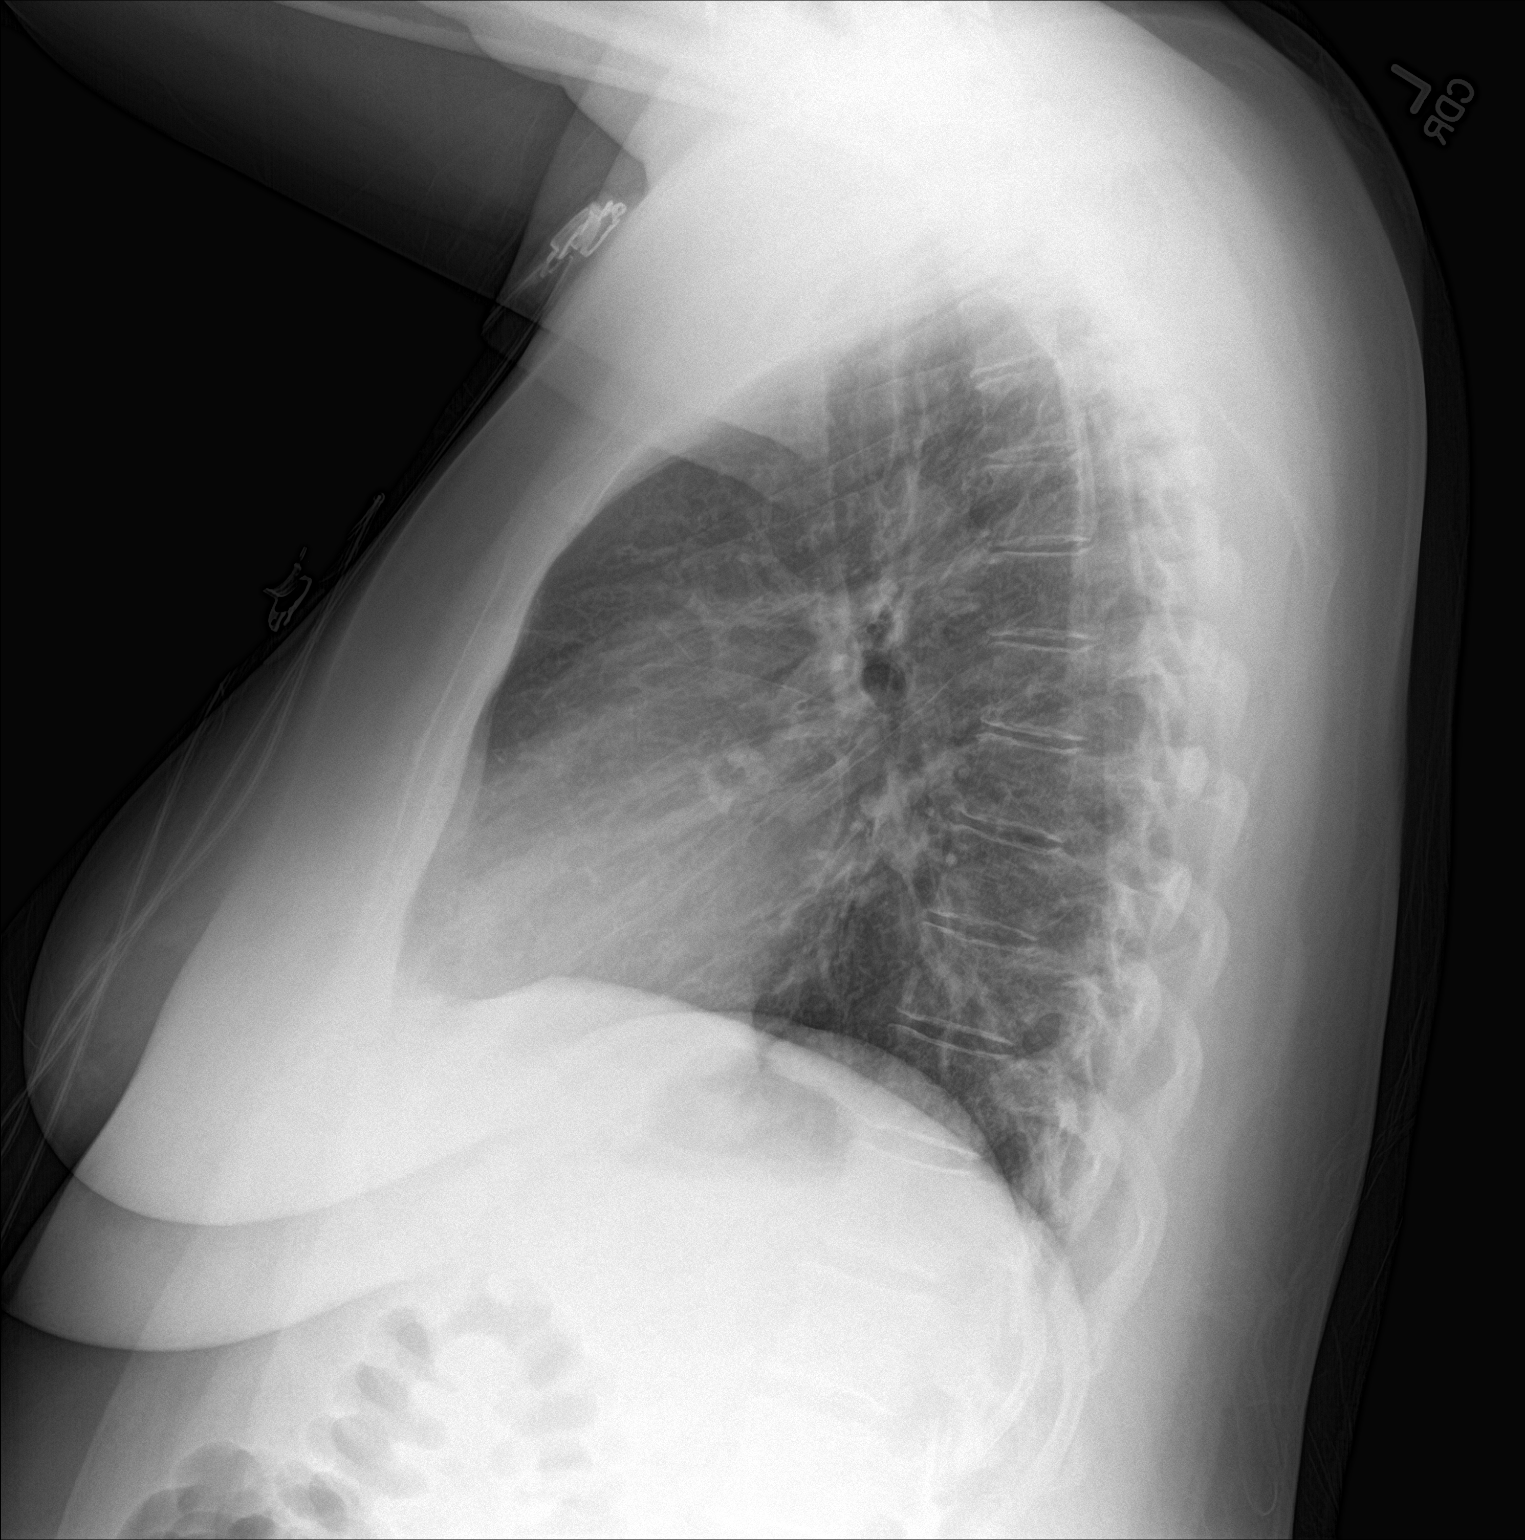

[2 of 2 positions shown; findings below may reference images not displayed]

FINDINGS: Low normal lung volumes. Normal cardiac size and mediastinal
contours. Visualized tracheal air column is within normal limits. No
pneumothorax, pulmonary edema, pleural effusion or confluent
pulmonary opacity. There is mildly increased interstitial opacity in
both lungs. Negative visible bowel gas pattern. No acute osseous
abnormality identified.
IMPRESSION: Negative chest except for mildly increased pulmonary interstitial
markings which could reflect viral or atypical respiratory
infection.

## 2023-06-18 ENCOUNTER — Other Ambulatory Visit: Payer: Self-pay | Admitting: Obstetrics and Gynecology

## 2023-06-18 DIAGNOSIS — R92333 Mammographic heterogeneous density, bilateral breasts: Secondary | ICD-10-CM

## 2023-12-03 ENCOUNTER — Ambulatory Visit: Payer: 59
